# Patient Record
Sex: Male | Born: 1965 | Race: White | Hispanic: No | Marital: Married | Smoking: Former smoker
Health system: Southern US, Community
[De-identification: ages and names within clinical notes are randomized; demographics above are authoritative.]

## PROBLEM LIST (undated history)

## (undated) DIAGNOSIS — E785 Hyperlipidemia, unspecified: Secondary | ICD-10-CM

## (undated) DIAGNOSIS — E119 Type 2 diabetes mellitus without complications: Secondary | ICD-10-CM

## (undated) DIAGNOSIS — I10 Essential (primary) hypertension: Secondary | ICD-10-CM

## (undated) HISTORY — PX: MOHS SURGERY: SHX181

## (undated) HISTORY — DX: Hyperlipidemia, unspecified: E78.5

---

## 2001-05-15 ENCOUNTER — Encounter: Payer: Self-pay | Admitting: Internal Medicine

## 2001-05-15 ENCOUNTER — Ambulatory Visit (HOSPITAL_COMMUNITY): Admission: RE | Admit: 2001-05-15 | Discharge: 2001-05-15 | Payer: Self-pay | Admitting: Internal Medicine

## 2002-09-18 ENCOUNTER — Other Ambulatory Visit: Admission: RE | Admit: 2002-09-18 | Discharge: 2002-09-18 | Payer: Self-pay | Admitting: Dermatology

## 2009-10-03 ENCOUNTER — Emergency Department (HOSPITAL_COMMUNITY): Admission: EM | Admit: 2009-10-03 | Discharge: 2009-10-03 | Payer: Self-pay | Admitting: Emergency Medicine

## 2017-02-06 ENCOUNTER — Other Ambulatory Visit (HOSPITAL_COMMUNITY): Payer: Self-pay | Admitting: Pulmonary Disease

## 2017-02-06 DIAGNOSIS — R310 Gross hematuria: Secondary | ICD-10-CM

## 2017-02-10 ENCOUNTER — Ambulatory Visit (HOSPITAL_COMMUNITY)
Admission: RE | Admit: 2017-02-10 | Discharge: 2017-02-10 | Disposition: A | Discharge status: Home / Self Care | Payer: BC Managed Care – PPO | Source: Ambulatory Visit | Attending: Pulmonary Disease | Admitting: Pulmonary Disease

## 2017-02-10 DIAGNOSIS — R310 Gross hematuria: Secondary | ICD-10-CM | POA: Diagnosis present

## 2017-11-25 ENCOUNTER — Encounter (HOSPITAL_COMMUNITY): Payer: Self-pay | Admitting: Emergency Medicine

## 2017-11-25 ENCOUNTER — Emergency Department (HOSPITAL_COMMUNITY)
Admission: EM | Admit: 2017-11-25 | Discharge: 2017-11-25 | Disposition: A | Discharge status: Home / Self Care | Payer: BC Managed Care – PPO | Attending: Emergency Medicine | Admitting: Emergency Medicine

## 2017-11-25 ENCOUNTER — Emergency Department (HOSPITAL_COMMUNITY): Payer: BC Managed Care – PPO

## 2017-11-25 ENCOUNTER — Other Ambulatory Visit: Payer: Self-pay

## 2017-11-25 DIAGNOSIS — I10 Essential (primary) hypertension: Secondary | ICD-10-CM | POA: Insufficient documentation

## 2017-11-25 DIAGNOSIS — F17220 Nicotine dependence, chewing tobacco, uncomplicated: Secondary | ICD-10-CM | POA: Diagnosis not present

## 2017-11-25 DIAGNOSIS — R109 Unspecified abdominal pain: Secondary | ICD-10-CM | POA: Diagnosis present

## 2017-11-25 DIAGNOSIS — Z79899 Other long term (current) drug therapy: Secondary | ICD-10-CM | POA: Insufficient documentation

## 2017-11-25 DIAGNOSIS — E119 Type 2 diabetes mellitus without complications: Secondary | ICD-10-CM | POA: Insufficient documentation

## 2017-11-25 DIAGNOSIS — Z87442 Personal history of urinary calculi: Secondary | ICD-10-CM | POA: Diagnosis not present

## 2017-11-25 DIAGNOSIS — N201 Calculus of ureter: Secondary | ICD-10-CM

## 2017-11-25 DIAGNOSIS — N23 Unspecified renal colic: Secondary | ICD-10-CM

## 2017-11-25 DIAGNOSIS — N132 Hydronephrosis with renal and ureteral calculous obstruction: Secondary | ICD-10-CM | POA: Diagnosis not present

## 2017-11-25 HISTORY — DX: Essential (primary) hypertension: I10

## 2017-11-25 HISTORY — DX: Type 2 diabetes mellitus without complications: E11.9

## 2017-11-25 LAB — URINALYSIS, ROUTINE W REFLEX MICROSCOPIC
Bacteria, UA: NONE SEEN
Bilirubin Urine: NEGATIVE
Glucose, UA: NEGATIVE mg/dL
Ketones, ur: 5 mg/dL — AB
Leukocytes, UA: NEGATIVE
Nitrite: NEGATIVE
Protein, ur: NEGATIVE mg/dL
Specific Gravity, Urine: 1.027 (ref 1.005–1.030)
pH: 5 (ref 5.0–8.0)

## 2017-11-25 LAB — I-STAT CHEM 8, ED
BUN: 25 mg/dL — AB (ref 6–20)
CREATININE: 1.3 mg/dL — AB (ref 0.61–1.24)
Calcium, Ion: 1.13 mmol/L — ABNORMAL LOW (ref 1.15–1.40)
Chloride: 103 mmol/L (ref 101–111)
GLUCOSE: 175 mg/dL — AB (ref 65–99)
HEMATOCRIT: 43 % (ref 39.0–52.0)
Hemoglobin: 14.6 g/dL (ref 13.0–17.0)
POTASSIUM: 3.8 mmol/L (ref 3.5–5.1)
Sodium: 141 mmol/L (ref 135–145)
TCO2: 24 mmol/L (ref 22–32)

## 2017-11-25 MED ORDER — ONDANSETRON 4 MG PO TBDP: 4.0000 mg | ORAL_TABLET | Freq: Three times a day (TID) | ORAL | 0 refills | Status: DC | PRN

## 2017-11-25 MED ORDER — FENTANYL CITRATE (PF) 100 MCG/2ML IJ SOLN
50.0000 ug | INTRAMUSCULAR | Status: DC | PRN
  Administered 2017-11-25: 50 ug via INTRAVENOUS
  Filled 2017-11-25: qty 2

## 2017-11-25 MED ORDER — OXYCODONE-ACETAMINOPHEN 5-325 MG PO TABS
2.0000 | ORAL_TABLET | Freq: Once | ORAL | Status: AC
  Administered 2017-11-25: 2 via ORAL
  Filled 2017-11-25: qty 2

## 2017-11-25 MED ORDER — MORPHINE SULFATE (PF) 4 MG/ML IV SOLN
4.0000 mg | INTRAVENOUS | Status: DC | PRN
  Administered 2017-11-25: 4 mg via INTRAVENOUS
  Filled 2017-11-25: qty 1

## 2017-11-25 MED ORDER — ONDANSETRON HCL 4 MG/2ML IJ SOLN
4.0000 mg | Freq: Once | INTRAMUSCULAR | Status: AC
  Administered 2017-11-25: 4 mg via INTRAVENOUS
  Filled 2017-11-25: qty 2

## 2017-11-25 MED ORDER — OXYCODONE-ACETAMINOPHEN 5-325 MG PO TABS: ORAL_TABLET | ORAL | 0 refills | Status: DC

## 2017-11-25 NOTE — ED Triage Notes (Signed)
Pt C/O flank pain that started around 0230 this morning. Pt states he has vomited 3 times since 0230.

## 2017-11-25 NOTE — ED Provider Notes (Signed)
[facility] EMERGENCY DEPARTMENT Provider Note   CSN: [id number] Arrival date & time: 11/25/17  0630     History   Chief Complaint Chief Complaint  Patient presents with  . Flank Pain    HPI Chris [name] is a 52 y.o. male.  HPI  Pt was seen at 0710. Per pt, c/o sudden onset and persistence of constant left sided flank "pain" that began 0200 PTA.  Pt describes the pain as "like my last kidney stone."  Has been associated with multiple intermittent episodes of N/V.  Denies testicular pain/swelling, no dysuria/hematuria, no abd pain, no diarrhea, no black or blood in emesis, no CP/SOB, no fevers, no rash.    Past Medical History:  Diagnosis Date  . Diabetes mellitus without complication ([place])   . Hypertension     There are no active problems to display for this patient.   History reviewed. No pertinent surgical history.     Home Medications    Prior to Admission medications   Medication Sig Start Date End Date Taking? Authorizing Provider  finasteride (PROSCAR) 5 MG tablet Take 5 mg by mouth daily.   Yes [provider]  lisinopril (PRINIVIL,ZESTRIL) 5 MG tablet Take 5 mg by mouth daily.   Yes [provider]    Family History No family history on file.  Social History Social History   Tobacco Use  . Smoking status: Never Smoker  . Smokeless tobacco: Current User    Types: Chew  Substance Use Topics  . Alcohol use: No    Frequency: Never  . Drug use: No     Allergies   Patient has no known allergies.   Review of Systems Review of Systems ROS: Statement: All systems negative except as marked or noted in the HPI; Constitutional: Negative for fever and chills. ; ; Eyes: Negative for eye pain, redness and discharge. ; ; ENMT: Negative for ear pain, hoarseness, nasal congestion, sinus pressure and sore throat. ; ; Cardiovascular: Negative for chest pain, palpitations, diaphoresis, dyspnea and peripheral edema. ; ; Respiratory: Negative  for cough, wheezing and stridor. ; ; Gastrointestinal: +N/V. Negative for diarrhea, abdominal pain, blood in stool, hematemesis, jaundice and rectal bleeding. . ; ; Genitourinary: +flank pain. Negative for dysuria and hematuria. ; ; Genital:  No penile drainage or rash, no testicular pain or swelling, no scrotal rash or swelling. ;;  Musculoskeletal: Negative for back pain and neck pain. Negative for swelling and trauma.; ; Skin: Negative for pruritus, rash, abrasions, blisters, bruising and skin lesion.; ; Neuro: Negative for headache, lightheadedness and neck stiffness. Negative for weakness, altered level of consciousness, altered mental status, extremity weakness, paresthesias, involuntary movement, seizure and syncope.       Physical Exam Updated Vital Signs BP (!) 134/100 (BP Location: Right Arm)   Pulse 81   Temp 97.8 F (36.6 C) (Oral)   Resp 15   Wt 79.4 kg (175 lb)   SpO2 100%    BP 111/75   Pulse (!) 55   Temp 97.8 F (36.6 C) (Oral)   Resp 15   Wt 79.4 kg (175 lb)   SpO2 97%    Physical Exam 0715: Physical examination:  Nursing notes reviewed; Vital signs and O2 SAT reviewed;  Constitutional: Well developed, Well nourished, Well hydrated, Uncomfortable appearing.; Head:  Normocephalic, atraumatic; Eyes: EOMI, PERRL, No scleral icterus; ENMT: Mouth and pharynx normal, Mucous membranes moist; Neck: Supple, Full range of motion, No lymphadenopathy; Cardiovascular: Regular rate and rhythm, No  gallop; Respiratory: Breath sounds clear & equal bilaterally, No wheezes.  Speaking full sentences with ease, Normal respiratory effort/excursion; Chest: Nontender, Movement normal; Abdomen: Soft, Nontender, Nondistended, Normal bowel sounds; Genitourinary: No CVA tenderness; Spine:  No midline CS, TS, LS tenderness.;; Extremities: Pulses normal, No tenderness, No edema, No calf edema or asymmetry.; Neuro: AA&Ox3, Major CN grossly intact.  Speech clear. No gross focal motor or sensory deficits  in extremities.; Skin: Color normal, Warm, Dry.   ED Treatments / Results  Labs (all labs ordered are listed, but only abnormal results are displayed)   EKG  EKG Interpretation None       Radiology   Procedures Procedures (including critical care time)  Medications Ordered in ED Medications  fentaNYL (SUBLIMAZE) injection 50 mcg (50 mcg Intravenous Given 11/25/17 0703)  morphine 4 MG/ML injection 4 mg (4 mg Intravenous Given 11/25/17 0727)  ondansetron (ZOFRAN) injection 4 mg (4 mg Intravenous Given 11/25/17 0703)     Initial Impression / Assessment and Plan / ED Course  I have reviewed the triage vital signs and the nursing notes.  Pertinent labs & imaging results that were available during my care of the patient were reviewed by me and considered in my medical decision making (see chart for details).  MDM Reviewed: previous chart, vitals and nursing note Interpretation: labs and CT scan   Results for orders placed or performed during the hospital encounter of 11/25/17  Urinalysis, Routine w reflex microscopic- may I&O cath if menses  Result Value Ref Range   Color, Urine YELLOW YELLOW   APPearance HAZY (A) CLEAR   Specific Gravity, Urine 1.027 1.005 - 1.030   pH 5.0 5.0 - 8.0   Glucose, UA NEGATIVE NEGATIVE mg/dL   Hgb urine dipstick LARGE (A) NEGATIVE   Bilirubin Urine NEGATIVE NEGATIVE   Ketones, ur 5 (A) NEGATIVE mg/dL   Protein, ur NEGATIVE NEGATIVE mg/dL   Nitrite NEGATIVE NEGATIVE   Leukocytes, UA NEGATIVE NEGATIVE   RBC / HPF 6-30 0 - 5 RBC/hpf   WBC, UA 0-5 0 - 5 WBC/hpf   Bacteria, UA NONE SEEN NONE SEEN   Squamous Epithelial / LPF 0-5 (A) NONE SEEN   Mucus PRESENT   I-stat Chem 8, ED  Result Value Ref Range   Sodium 141 135 - 145 mmol/L   Potassium 3.8 3.5 - 5.1 mmol/L   Chloride 103 101 - 111 mmol/L   BUN 25 (H) 6 - 20 mg/dL   Creatinine, Ser 1.30 (H) 0.61 - 1.24 mg/dL   Glucose, Bld 175 (H) 65 - 99 mg/dL   Calcium, Ion 1.13 (L) 1.15 - 1.40  mmol/L   TCO2 24 22 - 32 mmol/L   Hemoglobin 14.6 13.0 - 17.0 g/dL   HCT 43.0 39.0 - 52.0 %   Ct Renal Stone Study Result Date: 11/25/2017 CLINICAL DATA:  Left flank pain EXAM: CT ABDOMEN AND PELVIS WITHOUT CONTRAST TECHNIQUE: Multidetector CT imaging of the abdomen and pelvis was performed following the standard protocol without IV contrast. COMPARISON:  03/06/2017 FINDINGS: Lower chest: Lung bases are clear. No effusions. Heart is normal size. Hepatobiliary: 14 mm gallstone within the gallbladder. No focal hepatic abnormality. Pancreas: No focal abnormality or ductal dilatation. Spleen: No focal abnormality.  Normal size. Adrenals/Urinary Tract: Adrenal glands unremarkable. Punctate 2-3 mm stone in the lower pole of the left kidney. There is moderate left hydronephrosis and perinephric stranding. 3 mm stone at the left ureterovesical junction. No stones or hydronephrosis on the right. The bladder wall  appears thickened. Stomach/Bowel: Appendix is normal. Stomach, large and small bowel grossly unremarkable. Vascular/Lymphatic: No evidence of aneurysm or adenopathy. Reproductive: Grossly unremarkable. Other: No free fluid or free air. Musculoskeletal: No acute bony abnormality. IMPRESSION: 3 mm left UVJ stone with moderate left hydronephrosis and perinephric stranding. Left lower pole nephrolithiasis. Bladder wall thickening which could be related to some degree of bladder outlet obstruction or cystitis. Recommend clinical correlation. Prostate does not appear enlarged. Cholelithiasis. Electronically Signed   By: Rolm Baptise M.D.   On: 11/25/2017 07:36    0845:  No old labs to compare. Will hold NSAID for now; given hx DM and ACEI. No UTI on Udip; UC pending. Pt has seen [facility] previously; will refer back to them for f/u. Dx and testing d/w pt and family.  Questions answered.  Verb understanding, agreeable to d/c home with outpt f/u.    Final Clinical Impressions(s) / ED Diagnoses   Final  diagnoses:  None    ED Discharge Orders    None       Francine Graven, DO 11/27/17 2202

## 2017-11-25 NOTE — Discharge Instructions (Signed)
Take the prescriptions as directed.  Also continue to take your Proscar.  Call the Urologist on Monday to schedule a follow up appointment in the next 3 days.  Return to the Emergency Department immediately if worsening.

## 2017-11-26 LAB — URINE CULTURE: CULTURE: NO GROWTH

## 2019-02-18 IMAGING — CT CT RENAL STONE PROTOCOL
2 of 4 series · 16 of 46 positions shown, 18 images · non-contrast
Comparison: 03/06/2017

CLINICAL DATA: Left flank pain

EXAM:
CT ABDOMEN AND PELVIS WITHOUT CONTRAST
TECHNIQUE: Multidetector CT imaging of the abdomen and pelvis was performed
following the standard protocol without IV contrast.

[Series 2: axial st · axial · 0.77mm/px · z∈[+891,+1316]mm · 13 of 93 slices shown, 15 images]
[im 4/93  soft-tissue]
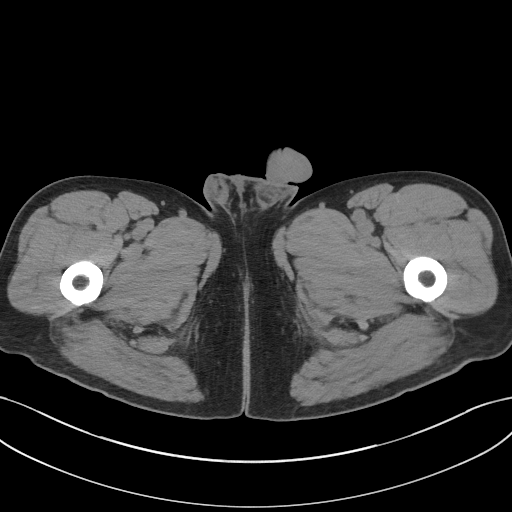
[im 4/93  bone]
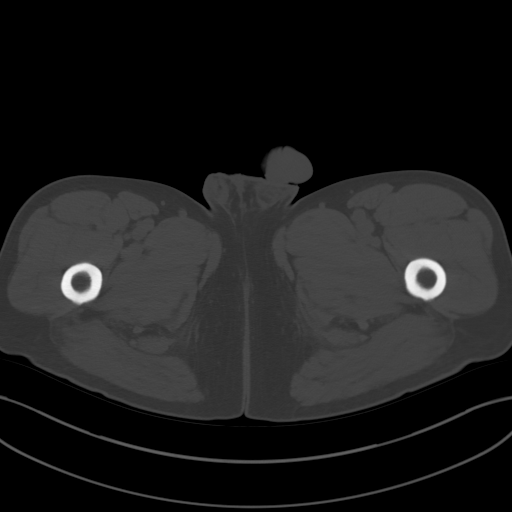
[im 12/93  soft-tissue]
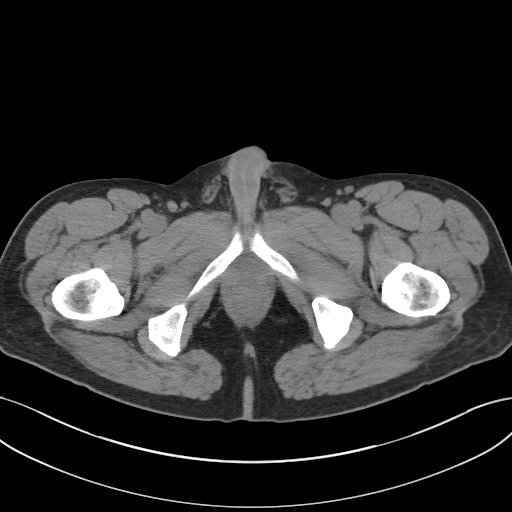
[im 19/93  soft-tissue]
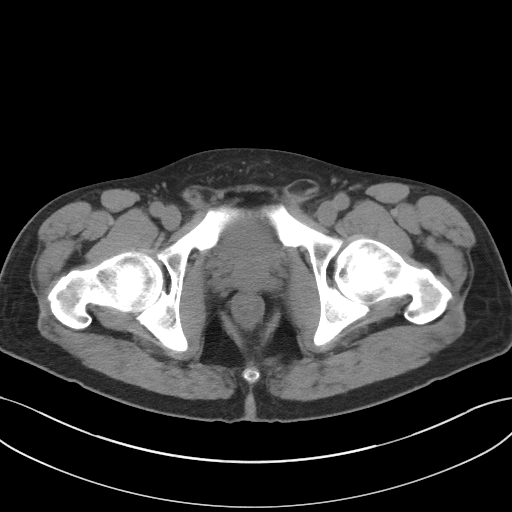
[im 26/93  soft-tissue]
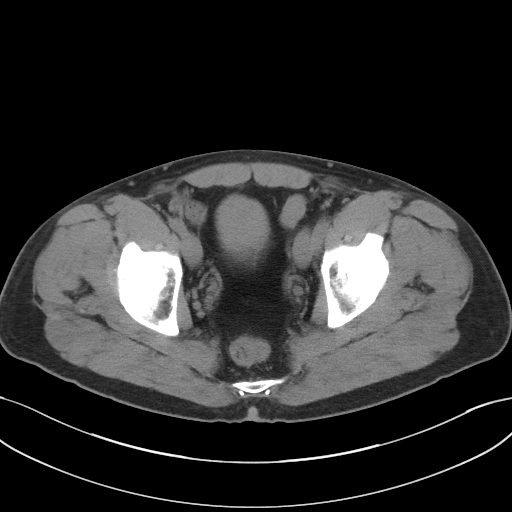
[im 34/93  soft-tissue]
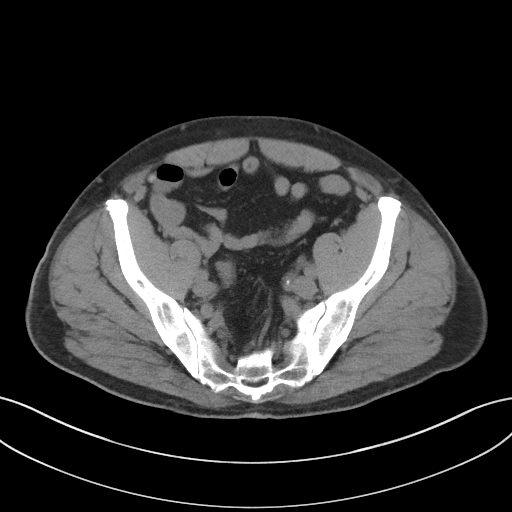
[im 41/93  soft-tissue]
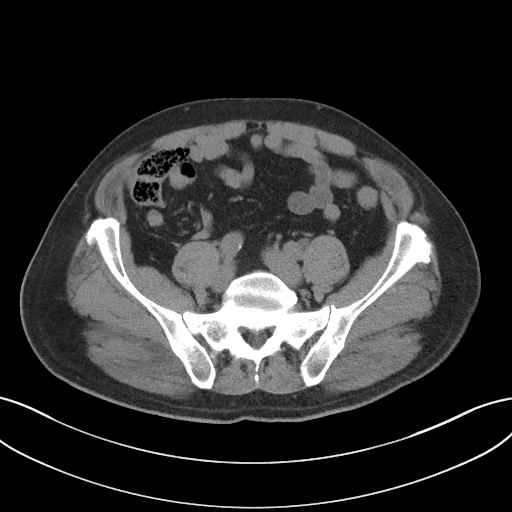
[im 48/93  soft-tissue]
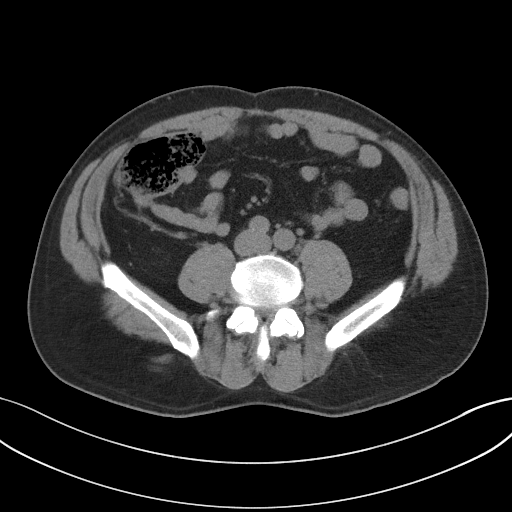
[im 52/93  soft-tissue]
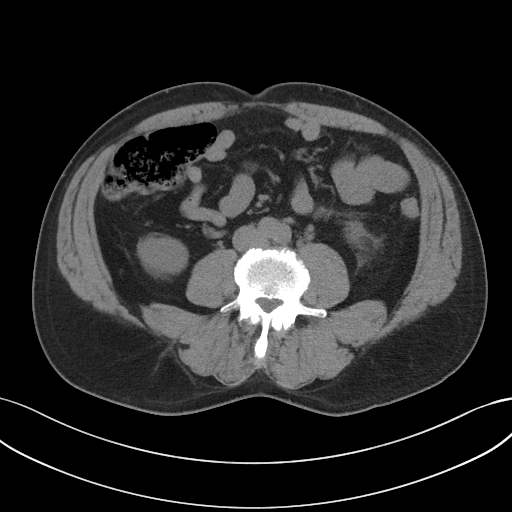
[im 59/93  soft-tissue]
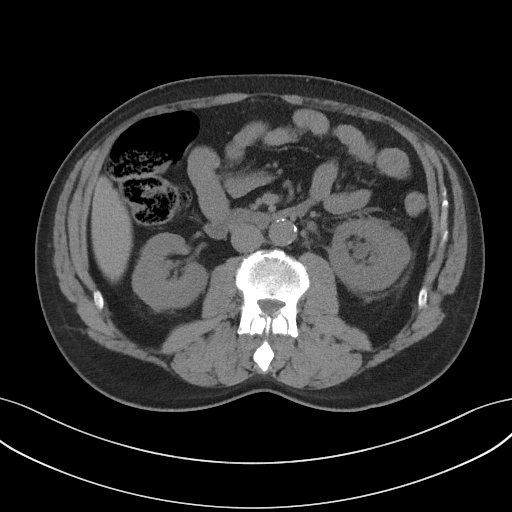
[im 59/93  bone]
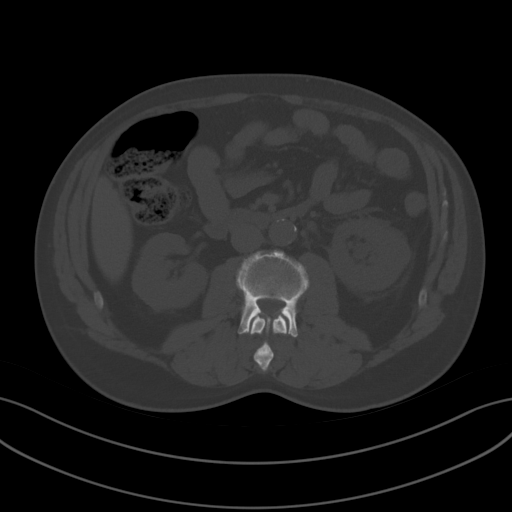
[im 67/93  soft-tissue]
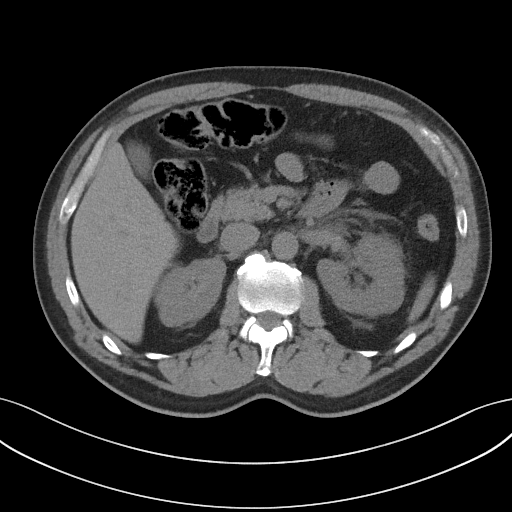
[im 74/93  soft-tissue]
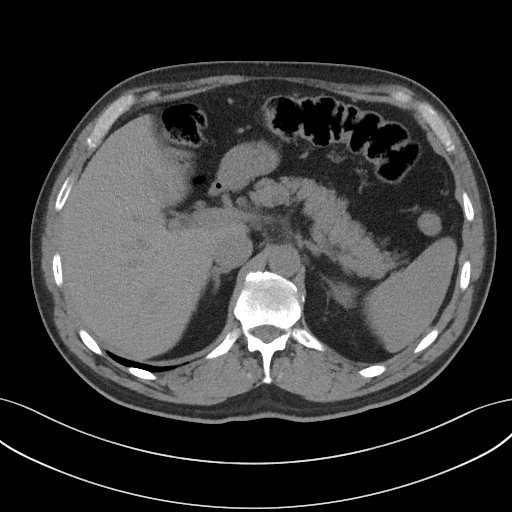
[im 81/93  soft-tissue]
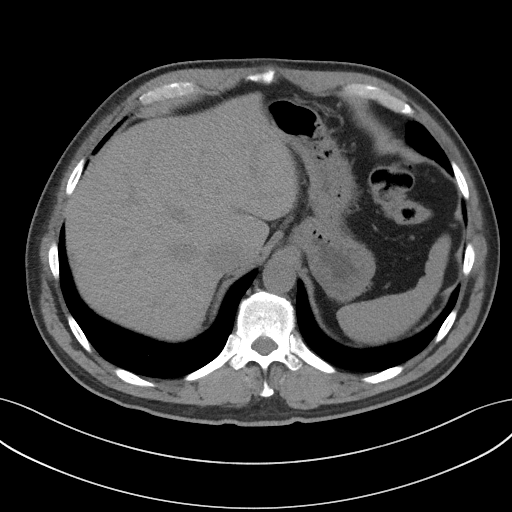
[im 89/93  soft-tissue]
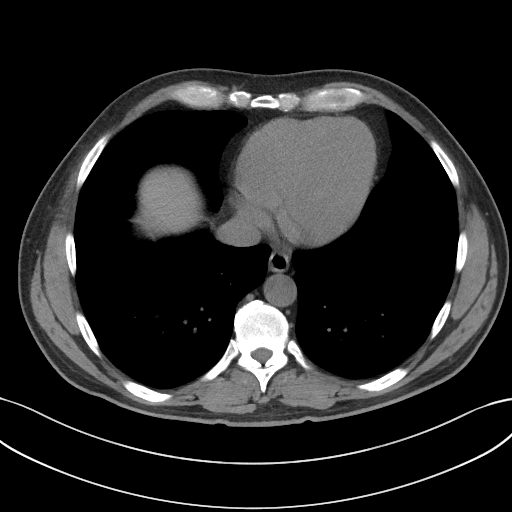

[Series 5: coronal st · coronal · 0.78mm/px · 3 of 110 slices shown]
[im 37/110  soft-tissue]
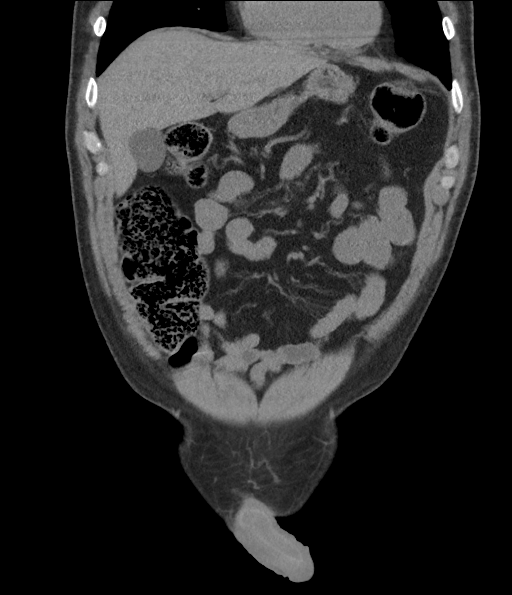
[im 49/110  soft-tissue]
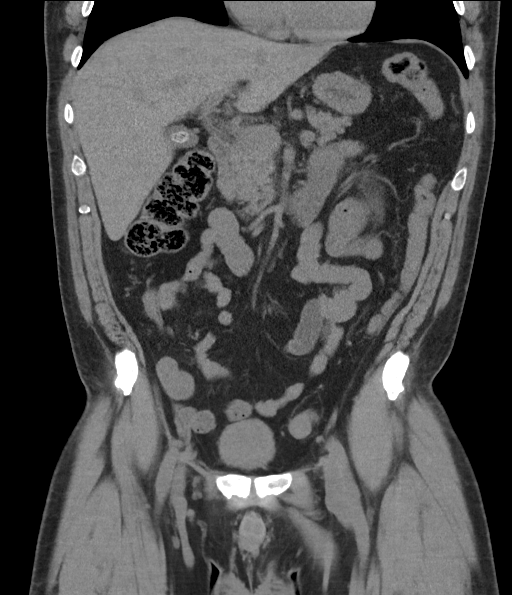
[im 61/110  soft-tissue]
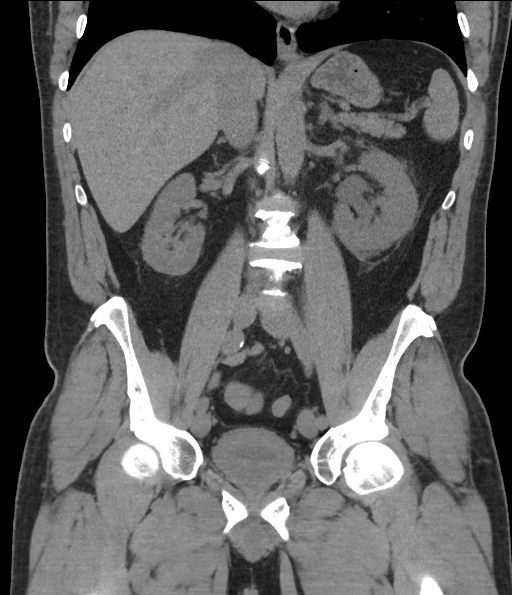

[16 of 46 positions shown; findings below may reference images not displayed]

FINDINGS: Lower chest: Lung bases are clear. No effusions. Heart is normal
size.

Hepatobiliary: 14 mm gallstone within the gallbladder. No focal
hepatic abnormality.

Pancreas: No focal abnormality or ductal dilatation.

Spleen: No focal abnormality.  Normal size.

Adrenals/Urinary Tract: Adrenal glands unremarkable. Punctate 2-3 mm
stone in the lower pole of the left kidney. There is moderate left
hydronephrosis and perinephric stranding. 3 mm stone at the left
ureterovesical junction. No stones or hydronephrosis on the right.
The bladder wall appears thickened.

Stomach/Bowel: Appendix is normal. Stomach, large and small bowel
grossly unremarkable.

Vascular/Lymphatic: No evidence of aneurysm or adenopathy.

Reproductive: Grossly unremarkable.

Other: No free fluid or free air.

Musculoskeletal: No acute bony abnormality.
IMPRESSION: 3 mm left UVJ stone with moderate left hydronephrosis and
perinephric stranding. Left lower pole nephrolithiasis.

Bladder wall thickening which could be related to some degree of
bladder outlet obstruction or cystitis. Recommend clinical
correlation. Prostate does not appear enlarged.

Cholelithiasis.

## 2019-11-13 ENCOUNTER — Ambulatory Visit: Payer: BC Managed Care – PPO | Admitting: Family Medicine

## 2020-03-03 ENCOUNTER — Ambulatory Visit: Payer: BC Managed Care – PPO | Admitting: Family Medicine

## 2020-05-06 ENCOUNTER — Ambulatory Visit: Payer: BC Managed Care – PPO | Admitting: Family Medicine

## 2020-05-06 ENCOUNTER — Encounter (INDEPENDENT_AMBULATORY_CARE_PROVIDER_SITE_OTHER): Payer: Self-pay | Admitting: *Deleted

## 2020-05-06 ENCOUNTER — Other Ambulatory Visit: Payer: Self-pay

## 2020-05-06 VITALS — BP 139/88 | HR 98 | Resp 16 | Ht 69.0 in | Wt 193.1 lb

## 2020-05-06 DIAGNOSIS — N401 Enlarged prostate with lower urinary tract symptoms: Secondary | ICD-10-CM | POA: Insufficient documentation

## 2020-05-06 DIAGNOSIS — I10 Essential (primary) hypertension: Secondary | ICD-10-CM | POA: Insufficient documentation

## 2020-05-06 DIAGNOSIS — Z1211 Encounter for screening for malignant neoplasm of colon: Secondary | ICD-10-CM | POA: Diagnosis not present

## 2020-05-06 DIAGNOSIS — Z0001 Encounter for general adult medical examination with abnormal findings: Secondary | ICD-10-CM | POA: Insufficient documentation

## 2020-05-06 DIAGNOSIS — Z6828 Body mass index (BMI) 28.0-28.9, adult: Secondary | ICD-10-CM | POA: Insufficient documentation

## 2020-05-06 DIAGNOSIS — R3912 Poor urinary stream: Secondary | ICD-10-CM

## 2020-05-06 DIAGNOSIS — E663 Overweight: Secondary | ICD-10-CM

## 2020-05-06 MED ORDER — LISINOPRIL 5 MG PO TABS: 5.0000 mg | ORAL_TABLET | Freq: Every day | ORAL | 1 refills | Status: DC

## 2020-05-06 MED ORDER — FINASTERIDE 5 MG PO TABS: 5.0000 mg | ORAL_TABLET | Freq: Every day | ORAL | 1 refills | Status: DC

## 2020-05-06 NOTE — Assessment & Plan Note (Signed)
Chris [name] is encouraged to maintain a well balanced diet that is low in salt. Higher level of normal control controlled, continue current medication regimen. Refills provided    Additionally, he is also reminded that exercise is beneficial for heart health and control of  Blood pressure. 30-60 minutes daily is recommended-walking was suggested.

## 2020-05-06 NOTE — Assessment & Plan Note (Signed)
Reestablishment with Proscar started.  He is encouraged to reach out if there is any problems or issues so that we can get him back into urology.

## 2020-05-06 NOTE — Assessment & Plan Note (Signed)
Deteriorated from previous numbers in chart. Chris [name] is educated about the importance of exercise daily to help with weight management. A minumum of 30 minutes daily is recommended. Additionally, importance of healthy food choices  with portion control discussed.  Wt Readings from Last 3 Encounters:  05/06/20 193 lb 1.9 oz (87.6 kg)  11/25/17 175 lb (79.4 kg)

## 2020-05-06 NOTE — Patient Instructions (Addendum)
I appreciate the opportunity to provide you with care for your health and wellness. Today we discussed: establish care   Follow up: 1 year for annual appt- morning fasting appt to get labs same day  No labs or referrals today  Nice to meet you today!  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  If you must go out, please wear a mask and practice good handwashing.  It was a pleasure to see you and I look forward to continuing to work together on your health and well-being. Please do not hesitate to call the office if you need care or have questions about your care.  Have a wonderful day and week. With Gratitude, Cherly Beach, DNP, AGNP-BC

## 2020-05-06 NOTE — Assessment & Plan Note (Signed)
Referral to Dr. Rosina Lowenstein office for colonoscopy provided.

## 2020-05-06 NOTE — Progress Notes (Signed)
Subjective:  Patient ID: Chris [name], male    DOB: 1966/06/26  Age: 54 y.o. MRN: [id number]  CC:  Chief Complaint  Patient presents with  . New Patient (Initial Visit)    establish care      HPI  HPI Chris [name] is a pleasant 54 year old gentleman previous patient of Dr. Luan Pulling who presents today to establish care.  Needs refills on his finasteride and lisinopril.  He has a limited history which includes hyperlipidemia, hypertension, BPH, type 2 diabetes that is diet controlled.  He denies having any issues or concerns today in the office.  He reports that he sleeps well.  He does have 2 bridges and sees a dentist regularly.  No trouble chewing or swallowing.  Reports some weak stream and a little bit of hesitancy with going to the bathroom that is consistent with BPH.  Needs to be restarted on Proscar.  Had blood in his urine previously but has not had any recently.  Denies having trouble going to have stools/bowel movements no blood in stools.  Denies having any memory issues.  Denies having any injuries or falls.  Denies having any hearing issues.  Reports he wears readers probably should see an eye doctor but overall doing well with his vision.  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  Had a previous history of Mohs surgery on the right side of his nose.  That was 15 years ago overall doing well since.  Health maintenance wise: He is overall up-to-date on all his vaccines.  Did get the J&J Covid vaccination.  Does need a colonoscopy is open to referral today.  He reports he is an avid runner up to 3 miles now frequently when he is running.  Drinks a decent amount of caffeine every day.  Works at [employer] in [employer] center.  Overall stress level is contained.  Lives with wife and daughter at home.  Today patient denies signs and symptoms of COVID 19 infection including fever, chills, cough, shortness of breath, and headache. Past Medical, Surgical, Social  History, Allergies, and Medications have been Reviewed.   Past Medical History:  Diagnosis Date  . Diabetes mellitus without complication ([place])   . Hyperlipidemia    Phreesia 05/06/2020  . Hypertension     Current Meds  Medication Sig  . finasteride (PROSCAR) 5 MG tablet Take 1 tablet (5 mg total) by mouth daily.  Marland Kitchen lisinopril (ZESTRIL) 5 MG tablet Take 1 tablet (5 mg total) by mouth daily.  . [DISCONTINUED] finasteride (PROSCAR) 5 MG tablet Take 5 mg by mouth daily.  . [DISCONTINUED] lisinopril (PRINIVIL,ZESTRIL) 5 MG tablet Take 5 mg by mouth daily.    ROS:  Review of Systems  Constitutional: Negative.   HENT: Negative.   Eyes: Negative.   Respiratory: Negative.   Cardiovascular: Negative.   Gastrointestinal: Negative.   Genitourinary: Negative.   Musculoskeletal: Negative.   Skin: Negative.   Neurological: Negative.   Endo/Heme/Allergies: Negative.   Psychiatric/Behavioral: Negative.   All other systems reviewed and are negative.    Objective:   Today's Vitals: BP 139/88   Pulse 98   Resp 16   Ht 5\' 9"  (1.753 m)   Wt 193 lb 1.9 oz (87.6 kg)   SpO2 95%   BMI 28.52 kg/m  Vitals with BMI 05/06/2020 11/25/2017 11/25/2017  Height 5\' 9"  - -  Weight 193 lbs 2 oz - 175 lbs  BMI 66.59 - -  Systolic  700 174 -  Diastolic 88 75 -  Pulse 98 55 -     Physical Exam Vitals and nursing note reviewed.  Constitutional:      Appearance: Normal appearance. He is well-developed, well-groomed and overweight.  HENT:     Head: Normocephalic and atraumatic.     Right Ear: External ear normal.     Left Ear: External ear normal.     Mouth/Throat:     Comments: Mask in place Eyes:     General:        Right eye: No discharge.        Left eye: No discharge.     Conjunctiva/sclera: Conjunctivae normal.  Cardiovascular:     Rate and Rhythm: Normal rate and regular rhythm.     Pulses: Normal pulses.     Heart sounds: Normal heart sounds.  Pulmonary:     Effort: Pulmonary  effort is normal.     Breath sounds: Normal breath sounds.  Musculoskeletal:        General: Normal range of motion.     Cervical back: Normal range of motion and neck supple.  Skin:    General: Skin is warm.  Neurological:     General: No focal deficit present.     Mental Status: He is alert and oriented to person, place, and time.  Psychiatric:        Attention and Perception: Attention normal.        Mood and Affect: Mood normal.        Speech: Speech normal.        Behavior: Behavior normal. Behavior is cooperative.        Thought Content: Thought content normal.        Cognition and Memory: Cognition normal.        Judgment: Judgment normal.     Comments: Good eye contact, good communication pleasant conversation    Assessment   1. Essential hypertension   2. Benign prostatic hyperplasia with weak urinary stream   3. Encounter for screening for malignant neoplasm of colon   4. Overweight with body mass index (BMI) of 28 to 28.9 in adult     Tests ordered Orders Placed This Encounter  Procedures  . Ambulatory referral to Gastroenterology     Plan: Please see assessment and plan per problem list above.   Meds ordered this encounter  Medications  . lisinopril (ZESTRIL) 5 MG tablet    Sig: Take 1 tablet (5 mg total) by mouth daily.    Dispense:  90 tablet    Refill:  1    Order Specific Question:   Supervising Provider    Answer:   SIMPSON, MARGARET E [[id number]]  . finasteride (PROSCAR) 5 MG tablet    Sig: Take 1 tablet (5 mg total) by mouth daily.    Dispense:  90 tablet    Refill:  1    Order Specific Question:   Supervising Provider    Answer:   Fayrene Helper [[id number]]    Patient to follow-up in 1 year, as needed Perlie Mayo, NP

## 2020-05-19 ENCOUNTER — Telehealth (INDEPENDENT_AMBULATORY_CARE_PROVIDER_SITE_OTHER): Payer: Self-pay | Admitting: *Deleted

## 2020-05-19 ENCOUNTER — Encounter (INDEPENDENT_AMBULATORY_CARE_PROVIDER_SITE_OTHER): Payer: Self-pay | Admitting: *Deleted

## 2020-05-19 ENCOUNTER — Other Ambulatory Visit (INDEPENDENT_AMBULATORY_CARE_PROVIDER_SITE_OTHER): Payer: Self-pay | Admitting: *Deleted

## 2020-05-19 MED ORDER — SUTAB 1479-225-188 MG PO TABS
1.0000 | ORAL_TABLET | Freq: Once | ORAL | 0 refills | Status: AC
Start: 2020-05-19 — End: 2020-05-19

## 2020-05-19 NOTE — Telephone Encounter (Signed)
Patient needs Sutab (copay card) ° °

## 2020-05-19 NOTE — Telephone Encounter (Signed)
Ok to schedule.  Thanks,  Bennye Nix Castaneda Mayorga, MD Gastroenterology and Hepatology [facility]  

## 2020-05-19 NOTE — Telephone Encounter (Signed)
Referring MD/PCP: mills   Procedure: tcs room 1 with BMP  Reason/Indication:  screening  Has patient had this procedure before?  no  If so, when, by whom and where?    Is there a family history of colon cancer?  no  Who?  What age when diagnosed?    Is patient diabetic?   no      Does patient have prosthetic heart valve or mechanical valve?  no  Do you have a pacemaker/defibrillator?  no  Has patient ever had endocarditis/atrial fibrillation? no  Does patient use oxygen? no  Has patient had joint replacement within last 12 months?  no  Is patient constipated or do they take laxatives? no  Does patient have a history of alcohol/drug use?  no  Is patient on blood thinner such as Coumadin, Plavix and/or Aspirin? no  Medications: lisinopril 5 mg daily, finasteride 5 mg daily, mvi daily  Allergies: nkda  Medication Adjustment per Dr Rehman/Dr Jenetta Downer   Procedure date & time: 06/16/20

## 2020-05-29 ENCOUNTER — Other Ambulatory Visit (INDEPENDENT_AMBULATORY_CARE_PROVIDER_SITE_OTHER): Payer: Self-pay

## 2020-05-29 DIAGNOSIS — Z1211 Encounter for screening for malignant neoplasm of colon: Secondary | ICD-10-CM

## 2020-06-09 ENCOUNTER — Encounter (HOSPITAL_COMMUNITY): Payer: Self-pay | Admitting: Gastroenterology

## 2020-06-15 ENCOUNTER — Other Ambulatory Visit: Payer: Self-pay

## 2020-06-15 ENCOUNTER — Other Ambulatory Visit (HOSPITAL_COMMUNITY)
Admission: RE | Admit: 2020-06-15 | Discharge: 2020-06-15 | Disposition: A | Discharge status: Home / Self Care | Payer: BC Managed Care – PPO | Source: Ambulatory Visit | Attending: Gastroenterology | Admitting: Gastroenterology

## 2020-06-15 DIAGNOSIS — Z79899 Other long term (current) drug therapy: Secondary | ICD-10-CM | POA: Diagnosis not present

## 2020-06-15 DIAGNOSIS — Z1211 Encounter for screening for malignant neoplasm of colon: Secondary | ICD-10-CM | POA: Diagnosis not present

## 2020-06-15 DIAGNOSIS — K648 Other hemorrhoids: Secondary | ICD-10-CM | POA: Diagnosis not present

## 2020-06-15 DIAGNOSIS — E119 Type 2 diabetes mellitus without complications: Secondary | ICD-10-CM | POA: Diagnosis not present

## 2020-06-15 DIAGNOSIS — K635 Polyp of colon: Secondary | ICD-10-CM | POA: Diagnosis not present

## 2020-06-15 DIAGNOSIS — Z20822 Contact with and (suspected) exposure to covid-19: Secondary | ICD-10-CM | POA: Diagnosis not present

## 2020-06-15 DIAGNOSIS — Z01812 Encounter for preprocedural laboratory examination: Secondary | ICD-10-CM | POA: Insufficient documentation

## 2020-06-15 DIAGNOSIS — I10 Essential (primary) hypertension: Secondary | ICD-10-CM | POA: Diagnosis not present

## 2020-06-15 DIAGNOSIS — D124 Benign neoplasm of descending colon: Secondary | ICD-10-CM | POA: Diagnosis not present

## 2020-06-15 LAB — BASIC METABOLIC PANEL
Anion gap: 10 (ref 5–15)
BUN: 29 mg/dL — ABNORMAL HIGH (ref 6–20)
CO2: 25 mmol/L (ref 22–32)
Calcium: 9.3 mg/dL (ref 8.9–10.3)
Chloride: 104 mmol/L (ref 98–111)
Creatinine, Ser: 0.9 mg/dL (ref 0.61–1.24)
GFR calc Af Amer: >60 mL/min (ref >60)
GFR calc non Af Amer: >60 mL/min (ref >60)
Glucose, Bld: 105 mg/dL — ABNORMAL HIGH (ref 70–99)
Potassium: 3.8 mmol/L (ref 3.5–5.1)
Sodium: 139 mmol/L (ref 135–145)

## 2020-06-15 LAB — SARS CORONAVIRUS 2 (TAT 6-24 HRS): SARS Coronavirus 2: NEGATIVE

## 2020-06-16 ENCOUNTER — Encounter (HOSPITAL_COMMUNITY): Payer: Self-pay | Admitting: Gastroenterology

## 2020-06-16 ENCOUNTER — Ambulatory Visit (HOSPITAL_COMMUNITY): Payer: BC Managed Care – PPO | Admitting: Anesthesiology

## 2020-06-16 ENCOUNTER — Ambulatory Visit (HOSPITAL_COMMUNITY)
Admission: RE | Admit: 2020-06-16 | Discharge: 2020-06-16 | Disposition: A | Discharge status: Home / Self Care | Payer: BC Managed Care – PPO | Attending: Gastroenterology | Admitting: Gastroenterology

## 2020-06-16 ENCOUNTER — Other Ambulatory Visit: Payer: Self-pay

## 2020-06-16 ENCOUNTER — Encounter (HOSPITAL_COMMUNITY)
Admission: RE | Disposition: A | Discharge status: Home / Self Care | Payer: Self-pay | Source: Home / Self Care | Attending: Gastroenterology

## 2020-06-16 DIAGNOSIS — K648 Other hemorrhoids: Secondary | ICD-10-CM | POA: Diagnosis not present

## 2020-06-16 DIAGNOSIS — D125 Benign neoplasm of sigmoid colon: Secondary | ICD-10-CM | POA: Diagnosis not present

## 2020-06-16 DIAGNOSIS — D124 Benign neoplasm of descending colon: Secondary | ICD-10-CM | POA: Diagnosis not present

## 2020-06-16 DIAGNOSIS — K635 Polyp of colon: Secondary | ICD-10-CM | POA: Insufficient documentation

## 2020-06-16 DIAGNOSIS — Z1211 Encounter for screening for malignant neoplasm of colon: Secondary | ICD-10-CM | POA: Diagnosis not present

## 2020-06-16 DIAGNOSIS — E119 Type 2 diabetes mellitus without complications: Secondary | ICD-10-CM | POA: Insufficient documentation

## 2020-06-16 DIAGNOSIS — I10 Essential (primary) hypertension: Secondary | ICD-10-CM | POA: Insufficient documentation

## 2020-06-16 DIAGNOSIS — Z79899 Other long term (current) drug therapy: Secondary | ICD-10-CM | POA: Insufficient documentation

## 2020-06-16 HISTORY — PX: POLYPECTOMY: SHX5525

## 2020-06-16 HISTORY — PX: COLONOSCOPY WITH PROPOFOL: SHX5780

## 2020-06-16 LAB — HM COLONOSCOPY

## 2020-06-16 SURGERY — COLONOSCOPY WITH PROPOFOL
Anesthesia: General

## 2020-06-16 MED ORDER — CHLORHEXIDINE GLUCONATE CLOTH 2 % EX PADS: 6.0000 | MEDICATED_PAD | Freq: Once | CUTANEOUS | Status: DC

## 2020-06-16 MED ORDER — PROPOFOL 10 MG/ML IV BOLUS
INTRAVENOUS | Status: DC | PRN
  Administered 2020-06-16: 50 mg via INTRAVENOUS
  Administered 2020-06-16: 150 mg via INTRAVENOUS

## 2020-06-16 MED ORDER — PROPOFOL 500 MG/50ML IV EMUL
INTRAVENOUS | Status: DC | PRN
  Administered 2020-06-16: 150 ug/kg/min via INTRAVENOUS

## 2020-06-16 MED ORDER — LACTATED RINGERS IV SOLN: INTRAVENOUS | Status: DC | PRN

## 2020-06-16 MED ORDER — LACTATED RINGERS IV SOLN: Freq: Once | INTRAVENOUS | Status: AC

## 2020-06-16 NOTE — Op Note (Signed)
[facility] Patient Name: Chris [name] Procedure Date: 06/16/2020 10:27 AM MRN: [id number] Date of Birth: 1966/03/31 Attending MD: Maylon Peppers ,  CSN: [id number] Age: 54 Admit Type: Outpatient Procedure:                Colonoscopy Indications:              Screening for colorectal malignant neoplasm Providers:                Maylon Peppers, Brea Sharon Seller, RN, Casimer Bilis, Technician Referring MD:              Medicines:                Monitored Anesthesia Care Complications:            No immediate complications. Estimated Blood Loss:     Estimated blood loss: none. Procedure:                Pre-Anesthesia Assessment:                           - Prior to the procedure, a History and Physical                            was performed, and patient medications, allergies                            and sensitivities were reviewed. The patient's                            tolerance of previous anesthesia was reviewed.                           - The risks and benefits of the procedure and the                            sedation options and risks were discussed with the                            patient. All questions were answered and informed                            consent was obtained.                           - ASA Grade Assessment: II - A patient with mild                            systemic disease.                           After obtaining informed consent, the colonoscope                            was passed under direct vision. Throughout the  procedure, the patient's blood pressure, pulse, and                            oxygen saturations were monitored continuously. The                            [id number]-[id number] ([id number]) scope was introduced through                            the anus and advanced to the the cecum, identified                            by appendiceal orifice and ileocecal valve.  The                            colonoscopy was performed without difficulty. The                            patient tolerated the procedure well. The quality                            of the bowel preparation was adequate. Scope                            withdrawal time was 17 minutes. Scope In: 10:43:18 AM Scope Out: 11:10:11 AM Scope Withdrawal Time: 0 hours 23 minutes 34 seconds  Total Procedure Duration: 0 hours 26 minutes 53 seconds  Findings:      The perianal and digital rectal examinations were normal.      A 3 mm polyp was found in the descending colon. The polyp was sessile.       The polyp was removed with a cold biopsy forceps. Resection and       retrieval were complete.      Two sessile polyps were found in the sigmoid colon. The polyps were 3 to       4 mm in size. These polyps were removed with a cold snare. Resection and       retrieval were complete.      Non-bleeding internal hemorrhoids were found during retroflexion. The       hemorrhoids were small. Impression:               - One 3 mm polyp in the descending colon, removed                            with a cold biopsy forceps. Resected and retrieved.                           - Two 3 to 4 mm polyps in the sigmoid colon,                            removed with a cold snare. Resected and retrieved.                           - Non-bleeding internal hemorrhoids. Moderate Sedation:  Per Anesthesia Care Recommendation:           - Discharge patient to home (ambulatory).                           - Resume previous diet.                           - Await pathology results.                           - Repeat colonoscopy for surveillance based on                            pathology results. Procedure Code(s):        --- Professional ---                           [phone], GC, Colonoscopy, flexible; with removal of                            tumor(s), polyp(s), or other lesion(s) by snare                             technique                           45380, 54, Colonoscopy, flexible; with biopsy,                            single or multiple Diagnosis Code(s):        --- Professional ---                           K63.5, Polyp of colon                           Z12.11, Encounter for screening for malignant                            neoplasm of colon                           K64.8, Other hemorrhoids CPT copyright 2019 American Medical Association. All rights reserved. The codes documented in this report are preliminary and upon coder review may  be revised to meet current compliance requirements. Maylon Peppers, MD Maylon Peppers,  06/16/2020 11:15:52 AM This report has been signed electronically. Number of Addenda: 0

## 2020-06-16 NOTE — H&P (Signed)
Chris [name] is an 54 y.o. male.   Chief Complaint: screening colonoscopy HPI: 54 y/o M with PMH HLD, HTN, DM, who comes to the hospital to undergo screening colonoscopy.  The patient has never had a colonoscopy in the past.  She denies having any complaints such as melena, hematochezia, abdominal pain or distention, change in her bowel movement consistency or frequency, no changes in her weight recently.  No family history of colorectal cancer.  Past Medical History:  Diagnosis Date  . Diabetes mellitus without complication ([place])   . Hyperlipidemia    [employer]rees[employer] 05/06/2020  . Hypertension     Past Surgical History:  Procedure Laterality Date  . MOHS SURGERY      History reviewed. No pertinent family history. Social History:  reports that he has never smoked. His smokeless tobacco use includes chew. He reports that he does not drink alcohol and does not use drugs.  Allergies: No Known Allergies  Medications Prior to Admission  Medication Sig Dispense Refill  . Cholecalciferol (VITAMIN D3 PO) Take 1 capsule by mouth daily.    . finasteride (PROSCAR) 5 MG tablet Take 1 tablet (5 mg total) by mouth daily. 90 tablet 1  . ibuprofen (ADVIL) 200 MG tablet Take 400 mg by mouth every 8 (eight) hours as needed for moderate pain.    Marland Kitchen lisinopril (ZESTRIL) 5 MG tablet Take 1 tablet (5 mg total) by mouth daily. 90 tablet 1  . loratadine (CLARITIN) 10 MG tablet Take 10 mg by mouth daily as needed for allergies.    . Multiple Vitamin (MULTIVITAMIN WITH MINERALS) TABS tablet Take 1 tablet by mouth daily.    Marland Kitchen zinc gluconate 50 MG tablet Take 50 mg by mouth daily.      Results for orders placed or performed during the hospital encounter of 06/16/20 (from the past 48 hour(s))  Basic metabolic panel     Status: Abnormal   Collection Time: 06/15/20  9:07 AM  Result Value Ref Range   Sodium 139 135 - 145 mmol/L   Potassium 3.8 3.5 - 5.1 mmol/L   Chloride 104 98 - 111 mmol/L   CO2 25 22 - 32  mmol/L   Glucose, Bld 105 (H) 70 - 99 mg/dL    Comment: Glucose reference range applies only to samples taken after fasting for at least 8 hours.   BUN 29 (H) 6 - 20 mg/dL   Creatinine, Ser 0.90 0.61 - 1.24 mg/dL   Calcium 9.3 8.9 - 10.3 mg/dL   GFR calc non Af Amer >60 >60 mL/min   GFR calc Af Amer >60 >60 mL/min   Anion gap 10 5 - 15    Comment: Performed at [facility], [address], [place], [place] [address]   No results found.  Review of Systems  Constitutional: Negative.   HENT: Negative.   Eyes: Negative.   Respiratory: Negative.   Cardiovascular: Negative.   Gastrointestinal: Negative.   Endocrine: Negative.   Genitourinary: Negative.   Musculoskeletal: Negative.   Skin: Negative.   Allergic/Immunologic: Negative.   Neurological: Negative.   Hematological: Negative.   Psychiatric/Behavioral: Negative.     Blood pressure (!) 144/87, temperature 97.8 F (36.6 C), temperature source Oral, resp. rate 13, height 5\' 9"  (1.753 m), weight 83.9 kg, SpO2 98 %. Physical Exam  GENERAL: The patient is AO x3, in no acute distress. HEENT: Head is normocephalic and atraumatic. EOMI are intact. Mouth is well hydrated and without lesions. NECK: Supple. No masses LUNGS:  Clear to auscultation. No presence of rhonchi/wheezing/rales. Adequate chest expansion HEART: RRR, normal s1 and s2. ABDOMEN: Soft, nontender, no guarding, no peritoneal signs, and nondistended. BS +. No masses. EXTREMITIES: Without any cyanosis, clubbing, rash, lesions or edema. NEUROLOGIC: AOx3, no focal motor deficit. SKIN: no jaundice, no rashes  Assessment/Plan 54 y/o M with PMH HLD, HTN, DM, who comes to the hospital to undergo screening colonoscopy. ,   The patient is at average risk for colorectal cancer.  We will proceed with colonoscopy today.   Harvel Quale, MD 06/16/2020, 8:42 AM

## 2020-06-16 NOTE — Anesthesia Postprocedure Evaluation (Signed)
Anesthesia Post Note  Patient: Nijah C Roessner  Procedure(s) Performed: COLONOSCOPY WITH PROPOFOL (N/A ) POLYPECTOMY  Patient location during evaluation: Endoscopy Anesthesia Type: General Level of consciousness: awake and alert and oriented Pain management: pain level controlled Vital Signs Assessment: post-procedure vital signs reviewed and stable Respiratory status: spontaneous breathing, nonlabored ventilation and respiratory function stable Cardiovascular status: blood pressure returned to baseline Postop Assessment: no headache and no apparent nausea or vomiting Anesthetic complications: no   No complications documented.   Last Vitals:  Vitals:   06/16/20 0801  BP: (!) 144/87  Resp: 13  Temp: 36.6 C  SpO2: 98%    Last Pain:  Vitals:   06/16/20 1037  TempSrc:   PainSc: 0-No pain                 Orlie Dakin

## 2020-06-16 NOTE — Anesthesia Preprocedure Evaluation (Addendum)
Anesthesia Evaluation  Patient identified by MRN, date of birth, ID band Patient awake    Reviewed: Allergy & Precautions, NPO status , Patient's Chart, lab work & pertinent test results  History of Anesthesia Complications Negative for: history of anesthetic complications  Airway Mallampati: II  TM Distance: >3 FB Neck ROM: Full    Dental  (+) Dental Advisory Given, Missing Bone graft in missing tooth 3 weeks ago:   Pulmonary neg pulmonary ROS,    Pulmonary exam normal breath sounds clear to auscultation       Cardiovascular Exercise Tolerance: Good hypertension, Pt. on medications Normal cardiovascular exam Rhythm:Regular Rate:Normal     Neuro/Psych negative neurological ROS  negative psych ROS   GI/Hepatic negative GI ROS, Neg liver ROS,   Endo/Other  diabetes (diet controlled), Well Controlled, Type 2  Renal/GU negative Renal ROS  negative genitourinary   Musculoskeletal negative musculoskeletal ROS (+)   Abdominal   Peds  Hematology negative hematology ROS (+)   Anesthesia Other Findings   Reproductive/Obstetrics negative OB ROS                           Anesthesia Physical Anesthesia Plan  ASA: II  Anesthesia Plan: General   Post-op Pain Management:    Induction: Intravenous  PONV Risk Score and Plan: TIVA  Airway Management Planned: Nasal Cannula and Natural Airway  Additional Equipment:   Intra-op Plan:   Post-operative Plan:   Informed Consent: I have reviewed the patients History and Physical, chart, labs and discussed the procedure including the risks, benefits and alternatives for the proposed anesthesia with the patient or authorized representative who has indicated his/her understanding and acceptance.       Plan Discussed with: CRNA and Surgeon  Anesthesia Plan Comments:         Anesthesia Quick Evaluation

## 2020-06-16 NOTE — Transfer of Care (Signed)
Immediate Anesthesia Transfer of Care Note  Patient: Chris [name]  Procedure(s) Performed: COLONOSCOPY WITH PROPOFOL (N/A ) POLYPECTOMY  Patient Location: Endoscopy Unit  Anesthesia Type:General  Level of Consciousness: awake, alert  and oriented  Airway & Oxygen Therapy: Patient Spontanous Breathing  Post-op Assessment: Report given to RN, Post -op Vital signs reviewed and stable and Patient moving all extremities X 4  Post vital signs: Reviewed and stable  Last Vitals:  Vitals Value Taken Time  BP    Temp    Pulse    Resp    SpO2      Last Pain:  Vitals:   06/16/20 1037  TempSrc:   PainSc: 0-No pain      Patients Stated Pain Goal: 9 (09/73/53 2992)  Complications: No complications documented.

## 2020-06-16 NOTE — Discharge Instructions (Signed)
You are being discharged to home.  Resume your previous diet.  We are waiting for your pathology results.  Your physician has recommended a repeat colonoscopy for surveillance based on pathology results.   PATIENT INSTRUCTIONS POST-ANESTHESIA  IMMEDIATELY FOLLOWING SURGERY:  Do not drive or operate machinery for the first twenty four hours after surgery.  Do not make any important decisions for twenty four hours after surgery or while taking narcotic pain medications or sedatives.  If you develop intractable nausea and vomiting or a severe headache please notify your doctor immediately.  FOLLOW-UP:  Please make an appointment with your surgeon as instructed. You do not need to follow up with anesthesia unless specifically instructed to do so.  WOUND CARE INSTRUCTIONS (if applicable):  Keep a dry clean dressing on the anesthesia/puncture wound site if there is drainage.  Once the wound has quit draining you may leave it open to air.  Generally you should leave the bandage intact for twenty four hours unless there is drainage.  If the epidural site drains for more than 36-48 hours please call the anesthesia department.  QUESTIONS?:  Please feel free to call your physician or the hospital operator if you have any questions, and they will be happy to assist you.       Colonoscopy, Adult, Care After This sheet gives you information about how to care for yourself after your procedure. Your doctor may also give you more specific instructions. If you have problems or questions, call your doctor. What can I expect after the procedure? After the procedure, it is common to have:  A small amount of blood in your poop (stool) for 24 hours.  Some gas.  Mild cramping or bloating in your belly (abdomen). Follow these instructions at home: Eating and drinking   Drink enough fluid to keep your pee (urine) pale yellow.  Follow instructions from your doctor about what you cannot eat or drink.  Return  to your normal diet as told by your doctor. Avoid heavy or fried foods that are hard to digest. Activity  Rest as told by your doctor.  Do not sit for a long time without moving. Get up to take short walks every 1-2 hours. This is important. Ask for help if you feel weak or unsteady.  Return to your normal activities as told by your doctor. Ask your doctor what activities are safe for you. To help cramping and bloating:   Try walking around.  Put heat on your belly as told by your doctor. Use the heat source that your doctor recommends, such as a moist heat pack or a heating pad. ? Put a towel between your skin and the heat source. ? Leave the heat on for 20-30 minutes. ? Remove the heat if your skin turns bright red. This is very important if you are unable to feel pain, heat, or cold. You may have a greater risk of getting burned. General instructions  For the first 24 hours after the procedure: ? Do not drive or use machinery. ? Do not sign important documents. ? Do not drink alcohol. ? Do your daily activities more slowly than normal. ? Eat foods that are soft and easy to digest.  Take over-the-counter or prescription medicines only as told by your doctor.  Keep all follow-up visits as told by your doctor. This is important. Contact a doctor if:  You have blood in your poop 2-3 days after the procedure. Get help right away if:  You have  more than a small amount of blood in your poop.  You see large clumps of tissue (blood clots) in your poop.  Your belly is swollen.  You feel like you may vomit (nauseous).  You vomit.  You have a fever.  You have belly pain that gets worse, and medicine does not help your pain. Summary  After the procedure, it is common to have a small amount of blood in your poop. You may also have mild cramping and bloating in your belly.  For the first 24 hours after the procedure, do not drive or use machinery, do not sign important  documents, and do not drink alcohol.  Get help right away if you have a lot of blood in your poop, feel like you may vomit, have a fever, or have more belly pain. This information is not intended to replace advice given to you by your health care provider. Make sure you discuss any questions you have with your health care provider. Document Revised: 04/01/2019 Document Reviewed: 04/01/2019 Elsevier Patient Education  [place].    Colon Polyps  Polyps are tissue growths inside the body. Polyps can grow in many places, including the large intestine (colon). A polyp may be a round bump or a mushroom-shaped growth. You could have one polyp or several. Most colon polyps are noncancerous (benign). However, some colon polyps can become cancerous over time. Finding and removing the polyps early can help prevent this. What are the causes? The exact cause of colon polyps is not known. What increases the risk? You are more likely to develop this condition if you:  Have a family history of colon cancer or colon polyps.  Are older than 74 or older than 45 if you are African American.  Have inflammatory bowel disease, such as ulcerative colitis or Crohn's disease.  Have certain hereditary conditions, such as: ? Familial adenomatous polyposis. ? Lynch syndrome. ? Turcot syndrome. ? Peutz-Jeghers syndrome.  Are overweight.  Smoke cigarettes.  Do not get enough exercise.  Drink too much alcohol.  Eat a diet that is high in fat and red meat and low in fiber.  Had childhood cancer that was treated with abdominal radiation. What are the signs or symptoms? Most polyps do not cause symptoms. If you have symptoms, they may include:  Blood coming from your rectum when having a bowel movement.  Blood in your stool. The stool may look dark red or black.  Abdominal pain.  A change in bowel habits, such as constipation or diarrhea. How is this diagnosed? This condition is diagnosed  with a colonoscopy. This is a procedure in which a lighted, flexible scope is inserted into the anus and then passed into the colon to examine the area. Polyps are sometimes found when a colonoscopy is done as part of routine cancer screening tests. How is this treated? Treatment for this condition involves removing any polyps that are found. Most polyps can be removed during a colonoscopy. Those polyps will then be tested for cancer. Additional treatment may be needed depending on the results of testing. Follow these instructions at home: Lifestyle  Maintain a healthy weight, or lose weight if recommended by your health care provider.  Exercise every day or as told by your health care provider.  Do not use any products that contain nicotine or tobacco, such as cigarettes and e-cigarettes. If you need help quitting, ask your health care provider.  If you drink alcohol, limit how much you have: ? 0-1  drink a day for women. ? 0-2 drinks a day for men.  Be aware of how much alcohol is in your drink. In the [place] one drink equals one 12 oz bottle of beer (355 mL), one 5 oz glass of wine (148 mL), or one 1 oz shot of hard liquor (44 mL). Eating and drinking   Eat foods that are high in fiber, such as fruits, vegetables, and whole grains.  Eat foods that are high in calcium and vitamin D, such as milk, cheese, yogurt, eggs, liver, fish, and broccoli.  Limit foods that are high in fat, such as fried foods and desserts.  Limit the amount of red meat and processed meat you eat, such as hot dogs, sausage, bacon, and lunch meats. General instructions  Keep all follow-up visits as told by your health care provider. This is important. ? This includes having regularly scheduled colonoscopies. ? Talk to your health care provider about when you need a colonoscopy. Contact a health care provider if:  You have new or worsening bleeding during a bowel movement.  You have new or increased blood in  your stool.  You have a change in bowel habits.  You lose weight for no known reason. Summary  Polyps are tissue growths inside the body. Polyps can grow in many places, including the colon.  Most colon polyps are noncancerous (benign), but some can become cancerous over time.  This condition is diagnosed with a colonoscopy.  Treatment for this condition involves removing any polyps that are found. Most polyps can be removed during a colonoscopy. This information is not intended to replace advice given to you by your health care provider. Make sure you discuss any questions you have with your health care provider. Document Revised: 12/21/2017 Document Reviewed: 12/21/2017 Elsevier Patient Education  [place].   Hemorrhoids Hemorrhoids are swollen veins that may develop:  In the butt (rectum). These are called internal hemorrhoids.  Around the opening of the butt (anus). These are called external hemorrhoids. Hemorrhoids can cause pain, itching, or bleeding. Most of the time, they do not cause serious problems. They usually get better with diet changes, lifestyle changes, and other home treatments. What are the causes? This condition may be caused by:  Having trouble pooping (constipation).  Pushing hard (straining) to poop.  Watery poop (diarrhea).  Pregnancy.  Being very overweight (obese).  Sitting for long periods of time.  Heavy lifting or other activity that causes you to strain.  Anal sex.  Riding a bike for a long period of time. What are the signs or symptoms? Symptoms of this condition include:  Pain.  Itching or soreness in the butt.  Bleeding from the butt.  Leaking poop.  Swelling in the area.  One or more lumps around the opening of your butt. How is this diagnosed? A doctor can often diagnose this condition by looking at the affected area. The doctor may also:  Do an exam that involves feeling the area with a gloved hand (digital  rectal exam).  Examine the area inside your butt using a small tube (anoscope).  Order blood tests. This may be done if you have lost a lot of blood.  Have you get a test that involves looking inside the colon using a flexible tube with a camera on the end (sigmoidoscopy or colonoscopy). How is this treated? This condition can usually be treated at home. Your doctor may tell you to change what you eat, make lifestyle changes, or  try home treatments. If these do not help, procedures can be done to remove the hemorrhoids or make them smaller. These may involve:  Placing rubber bands at the base of the hemorrhoids to cut off their blood supply.  Injecting medicine into the hemorrhoids to shrink them.  Shining a type of light energy onto the hemorrhoids to cause them to fall off.  Doing surgery to remove the hemorrhoids or cut off their blood supply. Follow these instructions at home: Eating and drinking   Eat foods that have a lot of fiber in them. These include whole grains, beans, nuts, fruits, and vegetables.  Ask your doctor about taking products that have added fiber (fibersupplements).  Reduce the amount of fat in your diet. You can do this by: ? Eating low-fat dairy products. ? Eating less red meat. ? Avoiding processed foods.  Drink enough fluid to keep your pee (urine) pale yellow. Managing pain and swelling   Take a warm-water bath (sitz bath) for 20 minutes to ease pain. Do this 3-4 times a day. You may do this in a bathtub or using a portable sitz bath that fits over the toilet.  If told, put ice on the painful area. It may be helpful to use ice between your warm baths. ? Put ice in a plastic bag. ? Place a towel between your skin and the bag. ? Leave the ice on for 20 minutes, 2-3 times a day. General instructions  Take over-the-counter and prescription medicines only as told by your doctor. ? Medicated creams and medicines may be used as told.  Exercise often.  Ask your doctor how much and what kind of exercise is best for you.  Go to the bathroom when you have the urge to poop. Do not wait.  Avoid pushing too hard when you poop.  Keep your butt dry and clean. Use wet toilet paper or moist towelettes after pooping.  Do not sit on the toilet for a long time.  Keep all follow-up visits as told by your doctor. This is important. Contact a doctor if you:  Have pain and swelling that do not get better with treatment or medicine.  Have trouble pooping.  Cannot poop.  Have pain or swelling outside the area of the hemorrhoids. Get help right away if you have:  Bleeding that will not stop. Summary  Hemorrhoids are swollen veins in the butt or around the opening of the butt.  They can cause pain, itching, or bleeding.  Eat foods that have a lot of fiber in them. These include whole grains, beans, nuts, fruits, and vegetables.  Take a warm-water bath (sitz bath) for 20 minutes to ease pain. Do this 3-4 times a day. This information is not intended to replace advice given to you by your health care provider. Make sure you discuss any questions you have with your health care provider. Document Revised: 09/13/2018 Document Reviewed: 01/25/2018 Elsevier Patient Education  [place].

## 2020-06-16 NOTE — Anesthesia Procedure Notes (Signed)
Date/Time: 06/16/2020 10:36 AM Performed by: Vista Deck, CRNA Pre-anesthesia Checklist: Patient identified, Emergency Drugs available, Suction available, Timeout performed and Patient being monitored Patient Re-evaluated:Patient Re-evaluated prior to induction Oxygen Delivery Method: Nasal Cannula

## 2020-06-17 LAB — SURGICAL PATHOLOGY

## 2020-06-22 ENCOUNTER — Encounter (HOSPITAL_COMMUNITY): Payer: Self-pay | Admitting: Gastroenterology

## 2020-07-25 ENCOUNTER — Other Ambulatory Visit: Payer: BC Managed Care – PPO

## 2020-07-25 ENCOUNTER — Other Ambulatory Visit: Payer: Self-pay

## 2020-07-25 DIAGNOSIS — Z20822 Contact with and (suspected) exposure to covid-19: Secondary | ICD-10-CM

## 2020-07-26 LAB — NOVEL CORONAVIRUS, NAA: SARS-CoV-2, NAA: NOT DETECTED

## 2020-07-26 LAB — SARS-COV-2, NAA 2 DAY TAT

## 2020-07-27 ENCOUNTER — Telehealth: Payer: Self-pay

## 2020-07-27 ENCOUNTER — Other Ambulatory Visit: Payer: BC Managed Care – PPO

## 2020-07-27 DIAGNOSIS — Z20822 Contact with and (suspected) exposure to covid-19: Secondary | ICD-10-CM

## 2020-07-27 NOTE — Telephone Encounter (Signed)
See other note

## 2020-07-28 LAB — SARS-COV-2, NAA 2 DAY TAT

## 2020-07-28 LAB — NOVEL CORONAVIRUS, NAA: SARS-CoV-2, NAA: NOT DETECTED

## 2020-07-28 NOTE — Telephone Encounter (Signed)
Lets see if second COVID swab is negative, if it is lets do phone visit to see what treatments we can do.

## 2020-07-28 NOTE — Telephone Encounter (Signed)
Pt informed

## 2020-07-28 NOTE — Telephone Encounter (Signed)
See other note

## 2020-08-17 ENCOUNTER — Encounter (INDEPENDENT_AMBULATORY_CARE_PROVIDER_SITE_OTHER): Payer: Self-pay | Admitting: *Deleted

## 2020-10-04 ENCOUNTER — Telehealth: Payer: BC Managed Care – PPO | Admitting: Family

## 2020-10-04 DIAGNOSIS — U071 COVID-19: Secondary | ICD-10-CM | POA: Diagnosis not present

## 2020-10-04 MED ORDER — FLUTICASONE PROPIONATE 50 MCG/ACT NA SUSP: 2.0000 | Freq: Every day | NASAL | 6 refills | Status: DC

## 2020-10-04 MED ORDER — ALBUTEROL SULFATE HFA 108 (90 BASE) MCG/ACT IN AERS: 2.0000 | INHALATION_SPRAY | Freq: Four times a day (QID) | RESPIRATORY_TRACT | 0 refills | Status: DC | PRN

## 2020-10-04 MED ORDER — BENZONATATE 100 MG PO CAPS: 100.0000 mg | ORAL_CAPSULE | Freq: Three times a day (TID) | ORAL | 0 refills | Status: DC | PRN

## 2020-10-04 NOTE — Progress Notes (Signed)
E-Visit for Corona Virus Screening  We are sorry you are not feeling well. We are here to help!  You have tested positive for COVID-19, meaning that you were infected with the novel coronavirus and could give the virus to others.  It is vitally important that you stay home so you do not spread it to others.      Please continue isolation at home, for at least 10 days since the start of your symptoms and until you have had 24 hours with no fever (without taking a fever reducer) and with improving of symptoms.  If you have no symptoms but tested positive (or all symptoms resolve after 5 days and you have no fever) you can leave your house but continue to wear a mask around others for an additional 5 days. If you have a fever,continue to stay home until you have had 24 hours of no fever. Most cases improve 5-10 days from onset but we have seen a small number of patients who have gotten worse after the 10 days.  Please be sure to watch for worsening symptoms and remain taking the proper precautions.   Go to the nearest hospital ED for assessment if fever/cough/breathlessness are severe or illness seems like a threat to life.    The following symptoms may appear 2-14 days after exposure: . Fever . Cough . Shortness of breath or difficulty breathing . Chills . Repeated shaking with chills . Muscle pain . Headache . Sore throat . New loss of taste or smell . Fatigue . Congestion or runny nose . Nausea or vomiting . Diarrhea  You have been enrolled in [facility] for COVID-19. Daily you will receive a questionnaire within the [facility] website. Our COVID-19 response team will be monitoring your responses daily.  You can use medication such as A prescription cough medication called Tessalon Perles 100 mg. You may take 1-2 capsules every 8 hours as needed for cough, A prescription inhaler called Albuterol MDI 90 mcg /actuation 2 puffs every 4 hours as needed for shortness of breath,  wheezing, cough and A prescription for Fluticasone nasal spray 2 sprays in each nostril one time per day     You may also take acetaminophen (Tylenol) as needed for fever.  HOME CARE: . Only take medications as instructed by your medical team. . Drink plenty of fluids and get plenty of rest. . A steam or ultrasonic humidifier can help if you have congestion.   GET HELP RIGHT AWAY IF YOU HAVE EMERGENCY WARNING SIGNS.  Call 911 or proceed to your closest emergency facility if: . You develop worsening high fever. . Trouble breathing . Bluish lips or face . Persistent pain or pressure in the chest . New confusion . Inability to wake or stay awake . You cough up blood. . Your symptoms become more severe . Inability to hold down food or fluids  This list is not all possible symptoms. Contact your medical provider for any symptoms that are severe or concerning to you.    Your e-visit answers were reviewed by a board certified advanced clinical practitioner to complete your personal care plan.  Depending on the condition, your plan could have included both over the counter or prescription medications.  If there is a problem please reply once you have received a response from your provider.  Your safety is important to [place].  If you have drug allergies check your prescription carefully.    You can use MyChart to ask questions  about today's visit, request a non-urgent call back, or ask for a work or school excuse for 24 hours related to this e-Visit. If it has been greater than 24 hours you will need to follow up with your provider, or enter a new e-Visit to address those concerns. You will get an e-mail in the next two days asking about your experience.  I hope that your e-visit has been valuable and will speed your recovery. Thank you for using e-visits.    Approximately 5 minutes was spent documenting and reviewing patient's chart.

## 2020-10-24 ENCOUNTER — Other Ambulatory Visit: Payer: Self-pay | Admitting: Family

## 2020-10-24 DIAGNOSIS — U071 COVID-19: Secondary | ICD-10-CM

## 2020-10-29 ENCOUNTER — Other Ambulatory Visit: Payer: Self-pay | Admitting: Family Medicine

## 2020-10-29 DIAGNOSIS — N401 Enlarged prostate with lower urinary tract symptoms: Secondary | ICD-10-CM

## 2020-10-29 DIAGNOSIS — I10 Essential (primary) hypertension: Secondary | ICD-10-CM

## 2021-02-01 ENCOUNTER — Other Ambulatory Visit: Payer: Self-pay | Admitting: Family

## 2021-02-01 ENCOUNTER — Other Ambulatory Visit: Payer: Self-pay | Admitting: Family Medicine

## 2021-02-01 DIAGNOSIS — U071 COVID-19: Secondary | ICD-10-CM

## 2021-04-01 ENCOUNTER — Other Ambulatory Visit: Payer: Self-pay | Admitting: Family

## 2021-04-01 DIAGNOSIS — U071 COVID-19: Secondary | ICD-10-CM

## 2021-04-28 ENCOUNTER — Other Ambulatory Visit: Payer: Self-pay

## 2021-04-28 DIAGNOSIS — I10 Essential (primary) hypertension: Secondary | ICD-10-CM

## 2021-04-28 DIAGNOSIS — N401 Enlarged prostate with lower urinary tract symptoms: Secondary | ICD-10-CM

## 2021-04-28 MED ORDER — LISINOPRIL 5 MG PO TABS: 5.0000 mg | ORAL_TABLET | Freq: Every day | ORAL | 1 refills | Status: DC

## 2021-04-28 MED ORDER — FINASTERIDE 5 MG PO TABS: 5.0000 mg | ORAL_TABLET | Freq: Every day | ORAL | 1 refills | Status: DC

## 2021-05-06 ENCOUNTER — Ambulatory Visit (INDEPENDENT_AMBULATORY_CARE_PROVIDER_SITE_OTHER): Payer: BC Managed Care – PPO | Admitting: Nurse Practitioner

## 2021-05-06 ENCOUNTER — Other Ambulatory Visit: Payer: Self-pay

## 2021-05-06 ENCOUNTER — Encounter: Payer: Self-pay | Admitting: Nurse Practitioner

## 2021-05-06 VITALS — BP 148/91 | HR 88 | Temp 98.1°F | Ht 69.0 in | Wt 191.0 lb

## 2021-05-06 DIAGNOSIS — Z0001 Encounter for general adult medical examination with abnormal findings: Secondary | ICD-10-CM

## 2021-05-06 DIAGNOSIS — Z139 Encounter for screening, unspecified: Secondary | ICD-10-CM

## 2021-05-06 DIAGNOSIS — I10 Essential (primary) hypertension: Secondary | ICD-10-CM | POA: Diagnosis not present

## 2021-05-06 DIAGNOSIS — N401 Enlarged prostate with lower urinary tract symptoms: Secondary | ICD-10-CM | POA: Diagnosis not present

## 2021-05-06 DIAGNOSIS — R3912 Poor urinary stream: Secondary | ICD-10-CM

## 2021-05-06 DIAGNOSIS — E119 Type 2 diabetes mellitus without complications: Secondary | ICD-10-CM

## 2021-05-06 NOTE — Assessment & Plan Note (Signed)
-  will screen for HCV and HIV with labs

## 2021-05-06 NOTE — Assessment & Plan Note (Signed)
-  checking PSA -no urinary issues reported

## 2021-05-06 NOTE — Patient Instructions (Signed)
Please have fasting labs drawn 2-3 days prior to your appointment so we can discuss the results during your office visit.  

## 2021-05-06 NOTE — Assessment & Plan Note (Signed)
-  he states his A1c was >10 with Dr. Luan Pulling, but he has been on a strict diet and exercising and that decreased his A1c to the point where he didn't need medication -with COVID, he states he has been eating worse -checking A1c today

## 2021-05-06 NOTE — Progress Notes (Signed)
Established Patient Office Visit  Subjective:  Patient ID: Chris [name], male    DOB: 1965-12-13  Age: 55 y.o. MRN: [id number]  CC:  Chief Complaint  Patient presents with   Annual Exam    CPE    HPI Chris [name] presents for physical exam. No recent labs.  Past Medical History:  Diagnosis Date   Diabetes mellitus without complication (HCC)    Hyperlipidemia    Phreesia 05/06/2020   Hypertension     Past Surgical History:  Procedure Laterality Date   COLONOSCOPY WITH PROPOFOL N/A 06/16/2020   Procedure: COLONOSCOPY WITH PROPOFOL;  Surgeon: Dolores Frame, MD;  Location: [facility];  Service: Gastroenterology;  Laterality: N/A;  900   MOHS SURGERY     POLYPECTOMY  06/16/2020   Procedure: POLYPECTOMY;  Surgeon: Dolores Frame, MD;  Location: [facility];  Service: Gastroenterology;;    History reviewed. No pertinent family history.  Social History   Socioeconomic History   Marital status: Married    Spouse name: Not on file   Number of children: 1   Years of education: Not on file   Highest education level: Not on file  Occupational History   Not on file  Tobacco Use   Smoking status: Never   Smokeless tobacco: Current    Types: Chew  Substance and Sexual Activity   Alcohol use: No    Comment: social 2-4 drinks a week    Drug use: No   Sexual activity: Yes  Other Topics Concern   Not on file  Social History Narrative   Lives with wife and daughter       Enjoys: outdoors stuff       Diet: eats all food groups    Caffeine: some coffee and tea daily 2-3 cups    Water: 6-8 cups daily      Wears seat belt    Does not use phone while driving    Smoke and carbon Magazine features editor in safe    Social Determinants of Health   Financial Resource Strain: Low Risk    Difficulty of Paying Living Expenses: Not hard at all  Food Insecurity: No Food Insecurity   Worried About Programme researcher, broadcasting/film/video in the  Last Year: Never true   Barista in the Last Year: Never true  Transportation Needs: No Transportation Needs   Lack of Transportation (Medical): No   Lack of Transportation (Non-Medical): No  Physical Activity: Sufficiently Active   Days of Exercise per Week: 5 days   Minutes of Exercise per Session: 30 min  Stress: No Stress Concern Present   Feeling of Stress : Only a little  Social Connections: Moderately Isolated   Frequency of Communication with Friends and Family: More than three times a week   Frequency of Social Gatherings with Friends and Family: Once a week   Attends Religious Services: Never   Database administrator or Organizations: No   Attends Engineer, structural: Never   Marital Status: Married  Catering manager Violence: Not At Risk   Fear of Current or Ex-Partner: No   Emotionally Abused: No   Physically Abused: No   Sexually Abused: No    Outpatient Medications Prior to Visit  Medication Sig Dispense Refill   Cholecalciferol (VITAMIN D3 PO) Take 1 capsule by mouth daily.     finasteride (PROSCAR) 5 MG tablet Take 1 tablet (5  mg total) by mouth daily. 90 tablet 1   fluticasone (FLONASE) 50 MCG/ACT nasal spray Place 2 sprays into both nostrils daily. 16 g 6   ibuprofen (ADVIL) 200 MG tablet Take 400 mg by mouth every 8 (eight) hours as needed for moderate pain.     lisinopril (ZESTRIL) 5 MG tablet Take 1 tablet (5 mg total) by mouth daily. 90 tablet 1   loratadine (CLARITIN) 10 MG tablet Take 10 mg by mouth daily as needed for allergies.     Multiple Vitamin (MULTIVITAMIN WITH MINERALS) TABS tablet Take 1 tablet by mouth daily.     albuterol (VENTOLIN HFA) 108 (90 Base) MCG/ACT inhaler Inhale 2 puffs into the lungs every 6 (six) hours as needed for wheezing or shortness of breath. (Patient not taking: Reported on 05/06/2021) 8 g 0   benzonatate (TESSALON PERLES) 100 MG capsule Take 1 capsule (100 mg total) by mouth 3 (three) times daily as needed.  (Patient not taking: Reported on 05/06/2021) 20 capsule 0   zinc gluconate 50 MG tablet Take 50 mg by mouth daily. (Patient not taking: Reported on 05/06/2021)     No facility-administered medications prior to visit.    No Known Allergies  ROS Review of Systems  Constitutional: Negative.   HENT: Negative.    Eyes: Negative.   Respiratory: Negative.    Cardiovascular: Negative.   Gastrointestinal: Negative.   Endocrine: Negative.   Genitourinary: Negative.   Musculoskeletal: Negative.   Skin: Negative.   Allergic/Immunologic: Negative.   Neurological: Negative.   Hematological: Negative.   Psychiatric/Behavioral: Negative.       Objective:    Physical Exam Constitutional:      Appearance: Normal appearance. He is obese.  HENT:     Head: Normocephalic and atraumatic.     Right Ear: Tympanic membrane, ear canal and external ear normal.     Left Ear: Tympanic membrane, ear canal and external ear normal.     Nose: Nose normal.     Mouth/Throat:     Mouth: Mucous membranes are moist.     Pharynx: Oropharynx is clear.  Eyes:     Extraocular Movements: Extraocular movements intact.     Conjunctiva/sclera: Conjunctivae normal.     Pupils: Pupils are equal, round, and reactive to light.  Cardiovascular:     Rate and Rhythm: Normal rate and regular rhythm.     Pulses: Normal pulses.     Heart sounds: Normal heart sounds.  Pulmonary:     Effort: Pulmonary effort is normal.     Breath sounds: Normal breath sounds.  Abdominal:     General: Abdomen is flat. Bowel sounds are normal.     Palpations: Abdomen is soft.  Musculoskeletal:        General: Normal range of motion.     Cervical back: Normal range of motion and neck supple.  Skin:    General: Skin is warm and dry.     Capillary Refill: Capillary refill takes less than 2 seconds.  Neurological:     General: No focal deficit present.     Mental Status: He is alert and oriented to person, place, and time.     Cranial  Nerves: No cranial nerve deficit.     Sensory: No sensory deficit.     Motor: No weakness.     Coordination: Coordination normal.     Gait: Gait normal.  Psychiatric:        Mood and Affect: Mood normal.  Behavior: Behavior normal.        Thought Content: Thought content normal.        Judgment: Judgment normal.     Comments: Anxious affect    BP (!) 148/91 (BP Location: Left Arm, Patient Position: Sitting, Cuff Size: Large)   Pulse 88   Temp 98.1 F (36.7 C) (Oral)   Ht $R'5\' 9"'Cd$  (1.753 m)   Wt 191 lb (86.6 kg)   SpO2 98%   BMI 28.21 kg/m  Wt Readings from Last 3 Encounters:  05/06/21 191 lb (86.6 kg)  06/16/20 185 lb (83.9 kg)  05/06/20 193 lb 1.9 oz (87.6 kg)     Health Maintenance Due  Topic Date Due   HEMOGLOBIN A1C  Never done   PNEUMOCOCCAL POLYSACCHARIDE VACCINE AGE 48-64 HIGH RISK  Never done   FOOT EXAM  Never done   OPHTHALMOLOGY EXAM  Never done   HIV Screening  Never done   Hepatitis C Screening  Never done   Zoster Vaccines- Shingrix (1 of 2) Never done   INFLUENZA VACCINE  04/19/2021    There are no preventive care reminders to display for this patient.  No results found for: TSH Lab Results  Component Value Date   HGB 14.6 11/25/2017   HCT 43.0 11/25/2017   Lab Results  Component Value Date   NA 139 06/15/2020   K 3.8 06/15/2020   CO2 25 06/15/2020   GLUCOSE 105 (H) 06/15/2020   BUN 29 (H) 06/15/2020   CREATININE 0.90 06/15/2020   CALCIUM 9.3 06/15/2020   ANIONGAP 10 06/15/2020   No results found for: CHOL No results found for: HDL No results found for: LDLCALC No results found for: TRIG No results found for: CHOLHDL No results found for: HGBA1C    Assessment & Plan:   Problem List Items Addressed This Visit       Cardiovascular and Mediastinum   Essential hypertension    BP Readings from Last 3 Encounters:  05/06/21 (!) 148/91  06/16/20 122/88  05/06/20 139/88  -he states he just had a large strong coffee and gets  nervous in doctor's office -he will check BP at home, if > 140/90 consistently he will return to clinic      Relevant Orders   CBC with Differential/Platelet   CMP14+EGFR   Lipid Panel With LDL/HDL Ratio   CBC with Differential/Platelet   CMP14+EGFR   Lipid Panel With LDL/HDL Ratio     Endocrine   Diabetes mellitus without complication ([facility])    -he states his A1c was >10 with Dr. Luan Pulling, but he has been on a strict diet and exercising and that decreased his A1c to the point where he didn't need medication -with COVID, he states he has been eating worse -checking A1c today      Relevant Orders   Hemoglobin A1c   CBC with Differential/Platelet   CMP14+EGFR   Lipid Panel With LDL/HDL Ratio   Hemoglobin A1c     Other   Encounter for general adult medical examination with abnormal findings - Primary   Relevant Orders   CBC with Differential/Platelet   CMP14+EGFR   Lipid Panel With LDL/HDL Ratio   TSH   Hepatitis C antibody   HIV Antibody (routine testing w rflx)   PSA   Benign prostatic hyperplasia with weak urinary stream    -checking PSA -no urinary issues reported      Relevant Orders   PSA   Screening due    -will screen  for HCV and HIV with labs      Relevant Orders   Hepatitis C antibody   HIV Antibody (routine testing w rflx)    No orders of the defined types were placed in this encounter.   Follow-up: Return in about 6 months (around 11/06/2021) for Lab follow-up (HTN).    Noreene Larsson, NP

## 2021-05-06 NOTE — Assessment & Plan Note (Signed)
BP Readings from Last 3 Encounters:  05/06/21 (!) 148/91  06/16/20 122/88  05/06/20 139/88   -he states he just had a large strong coffee and gets nervous in doctor's office -he will check BP at home, if > 140/90 consistently he will return to clinic

## 2021-05-07 ENCOUNTER — Telehealth: Payer: Self-pay

## 2021-05-07 ENCOUNTER — Encounter: Payer: BC Managed Care – PPO | Admitting: Family Medicine

## 2021-05-07 ENCOUNTER — Other Ambulatory Visit: Payer: Self-pay | Admitting: Nurse Practitioner

## 2021-05-07 DIAGNOSIS — E7800 Pure hypercholesterolemia, unspecified: Secondary | ICD-10-CM

## 2021-05-07 LAB — CBC WITH DIFFERENTIAL/PLATELET
Basophils Absolute: 0 10*3/uL (ref 0.0–0.2)
Basos: 0 %
EOS (ABSOLUTE): 0.1 10*3/uL (ref 0.0–0.4)
Eos: 1 %
Hematocrit: 43.7 % (ref 37.5–51.0)
Hemoglobin: 15.5 g/dL (ref 13.0–17.7)
Immature Grans (Abs): 0 10*3/uL (ref 0.0–0.1)
Immature Granulocytes: 0 %
Lymphocytes Absolute: 1.9 10*3/uL (ref 0.7–3.1)
Lymphs: 30 %
MCH: 31.5 pg (ref 26.6–33.0)
MCHC: 35.5 g/dL (ref 31.5–35.7)
MCV: 89 fL (ref 79–97)
Monocytes Absolute: 0.4 10*3/uL (ref 0.1–0.9)
Monocytes: 7 %
Neutrophils Absolute: 3.9 10*3/uL (ref 1.4–7.0)
Neutrophils: 62 %
Platelets: 177 10*3/uL (ref 150–450)
RBC: 4.92 x10E6/uL (ref 4.14–5.80)
RDW: 11.9 % (ref 11.6–15.4)
WBC: 6.3 10*3/uL (ref 3.4–10.8)

## 2021-05-07 LAB — LIPID PANEL WITH LDL/HDL RATIO
Cholesterol, Total: 227 mg/dL — ABNORMAL HIGH (ref 100–199)
HDL: 41 mg/dL (ref >39)
LDL Chol Calc (NIH): 164 mg/dL — ABNORMAL HIGH (ref 0–99)
LDL/HDL Ratio: 4 ratio — ABNORMAL HIGH (ref 0.0–3.6)
Triglycerides: 120 mg/dL (ref 0–149)
VLDL Cholesterol Cal: 22 mg/dL (ref 5–40)

## 2021-05-07 LAB — CMP14+EGFR
ALT: 30 IU/L (ref 0–44)
AST: 28 IU/L (ref 0–40)
Albumin/Globulin Ratio: 2.8 — ABNORMAL HIGH (ref 1.2–2.2)
Albumin: 4.8 g/dL (ref 3.8–4.9)
Alkaline Phosphatase: 47 IU/L (ref 44–121)
BUN/Creatinine Ratio: 21 — ABNORMAL HIGH (ref 9–20)
BUN: 20 mg/dL (ref 6–24)
Bilirubin Total: 0.5 mg/dL (ref 0.0–1.2)
CO2: 21 mmol/L (ref 20–29)
Calcium: 9.7 mg/dL (ref 8.7–10.2)
Chloride: 101 mmol/L (ref 96–106)
Creatinine, Ser: 0.94 mg/dL (ref 0.76–1.27)
Globulin, Total: 1.7 g/dL (ref 1.5–4.5)
Glucose: 150 mg/dL — ABNORMAL HIGH (ref 65–99)
Potassium: 4 mmol/L (ref 3.5–5.2)
Sodium: 139 mmol/L (ref 134–144)
Total Protein: 6.5 g/dL (ref 6.0–8.5)
eGFR: 95 mL/min/{1.73_m2} (ref >59)

## 2021-05-07 LAB — HEMOGLOBIN A1C
Est. average glucose Bld gHb Est-mCnc: 128 mg/dL
Hgb A1c MFr Bld: 6.1 % — ABNORMAL HIGH (ref 4.8–5.6)

## 2021-05-07 LAB — HIV ANTIBODY (ROUTINE TESTING W REFLEX): HIV Screen 4th Generation wRfx: NONREACTIVE

## 2021-05-07 LAB — HEPATITIS C ANTIBODY: Hep C Virus Ab: 0.1 s/co ratio (ref 0.0–0.9)

## 2021-05-07 LAB — TSH: TSH: 1.39 u[IU]/mL (ref 0.450–4.500)

## 2021-05-07 LAB — PSA: Prostate Specific Ag, Serum: <0.1 ng/mL (ref 0.0–4.0)

## 2021-05-07 MED ORDER — ATORVASTATIN CALCIUM 20 MG PO TABS: 20.0000 mg | ORAL_TABLET | Freq: Every day | ORAL | 3 refills | Status: DC

## 2021-05-07 NOTE — Progress Notes (Signed)
A1c is at 6.1, so that is great. Kidney and liver function are great as well. LDL, your bad cholesterol is elevated. I sent in atorvastatin for his cholesterol.

## 2021-05-07 NOTE — Progress Notes (Signed)
Rx. Atorvastatin for HLD

## 2021-05-07 NOTE — Telephone Encounter (Signed)
Patient returned your call in regards to lab results ph# [phone]

## 2021-11-11 ENCOUNTER — Ambulatory Visit: Payer: BC Managed Care – PPO | Admitting: Nurse Practitioner

## 2021-11-11 ENCOUNTER — Other Ambulatory Visit: Payer: Self-pay

## 2021-11-11 ENCOUNTER — Encounter: Payer: Self-pay | Admitting: Nurse Practitioner

## 2021-11-11 VITALS — BP 144/78 | HR 82 | Ht 69.0 in | Wt 184.0 lb

## 2021-11-11 DIAGNOSIS — E7800 Pure hypercholesterolemia, unspecified: Secondary | ICD-10-CM | POA: Diagnosis not present

## 2021-11-11 DIAGNOSIS — N401 Enlarged prostate with lower urinary tract symptoms: Secondary | ICD-10-CM

## 2021-11-11 DIAGNOSIS — R3912 Poor urinary stream: Secondary | ICD-10-CM

## 2021-11-11 DIAGNOSIS — E119 Type 2 diabetes mellitus without complications: Secondary | ICD-10-CM | POA: Diagnosis not present

## 2021-11-11 DIAGNOSIS — I10 Essential (primary) hypertension: Secondary | ICD-10-CM

## 2021-11-11 DIAGNOSIS — Z6828 Body mass index (BMI) 28.0-28.9, adult: Secondary | ICD-10-CM

## 2021-11-11 DIAGNOSIS — E663 Overweight: Secondary | ICD-10-CM

## 2021-11-11 DIAGNOSIS — E1169 Type 2 diabetes mellitus with other specified complication: Secondary | ICD-10-CM | POA: Insufficient documentation

## 2021-11-11 MED ORDER — FINASTERIDE 5 MG PO TABS: 5.0000 mg | ORAL_TABLET | Freq: Every day | ORAL | 1 refills | Status: DC

## 2021-11-11 MED ORDER — LISINOPRIL 5 MG PO TABS: 5.0000 mg | ORAL_TABLET | Freq: Every day | ORAL | 1 refills | Status: DC

## 2021-11-11 NOTE — Assessment & Plan Note (Signed)
Lab Results  Component Value Date   PSA1 <0.1 05/06/2021   Takes Proscar 5 mg tablet daily. Patient states that he has some hesitancy sometimes, he refused referral to urology.  He states that this symptom is not really bothering him. Continue Proscar 5 mg tablet daily

## 2021-11-11 NOTE — Progress Notes (Signed)
° °  Chris [name]     MRN: [id number]      DOB: 1966-07-26   HPI Chris [name] past medical history of type 2 diabetes, hypertension lipidemia, hypertension, BPH is here for follow up for his chronic medical conditions. he thinks that his Proscar is not helping as it used to be . He saw a urology some years ago and was started on Proscar 5 mg daily, has a little straining with urinating,pt states that he is not seriousl concerned about this. Pt denies bloody urine, painful urination  Patient stated that he has not been taking atorvastatin 20 mg because he knows different people that took the same medication and had muscular dystrophy, patient stated that he was not fasting when he had blood work done 6 months ago.   Patient refused flu vaccine today need for vaccine discussed with patient he verbalized understanding  There are no new concerns.  There are no specific complaints    ROS Denies recent fever or chills. Denies sinus pressure, nasal congestion, ear pain or sore throat. Denies chest congestion, productive cough or wheezing. Denies chest pains, palpitations and leg swelling Denies abdominal pain, nausea, vomiting,diarrhea or constipation.   Denies dysuria, frequency,  or incontinence has hesitancy Denies joint pain, swelling and limitation in mobility. Denies headaches, seizures, numbness, or tingling. Denies depression, anxiety or insomnia. Denies skin break down or rash.   PE  BP (!) 144/78    Pulse 82    Ht 5\' 9"  (1.753 m)    Wt 184 lb 0.6 oz (83.5 kg)    SpO2 96%    BMI 27.18 kg/m   Patient alert and oriented and in no cardiopulmonary distress.  Chest: Clear to auscultation bilaterally.  CVS: S1, S2 no murmurs, no S3.Regular rate.  ABD: Soft non tender.   MS: Adequate ROM spine, shoulders, hips and knees.  Skin: Intact, no ulcerations or rash noted.  Psych: Good eye contact, normal affect. Memory intact not anxious or depressed appearing.  CNS: CN 2-12 intact,  power,  normal throughout.no focal deficits noted.   Assessment & Plan

## 2021-11-11 NOTE — Assessment & Plan Note (Signed)
Lab Results  Component Value Date   HGBA1C 6.1 (H) 05/06/2021   Patient states that his A1c was greater than 10 when he was with Dr. Luan Pulling. A1c 6 months ago was 6.1, patient is not on diabetic medication. Patient told to avoid sugar sweetened soda, Patient refused statin,need to be on statin or order cholesterol-lowering medications in order to help reduce risk of developing stroke discussed with patient.  Patient stated that he will continue to work on diet and increase exercise Patient is due for eye exam and foot exam, will do foot exam at next visit and schedule patient for eye exam at next visit,. A1c ordered today.

## 2021-11-11 NOTE — Assessment & Plan Note (Addendum)
Wt Readings from Last 3 Encounters:  11/11/21 184 lb 0.6 oz (83.5 kg)  05/06/21 191 lb (86.6 kg)  06/16/20 185 lb (83.9 kg)  Patient congratulated on losing 7 pounds since last visit. Importance of vigorous exercise 30 minutes 5 times a week and healthy food choices consisting of 70% of  veggies and proteins less carbohydrates discussed with patient he verbalized understanding.

## 2021-11-11 NOTE — Assessment & Plan Note (Signed)
BP Readings from Last 3 Encounters:  11/11/21 (!) 144/78  05/06/21 (!) 148/91  06/16/20 122/88  Patient states that he has been out off lisinopril 5 mg for about a week Lisinopril 5 mg tablet refilled today patient told to take medication daily DASHDiet advised, need for vigorous exercise 30 minutes 5 times a week discussed with patient he verbalized understanding. CMP plus EGFR ordered today

## 2021-11-11 NOTE — Patient Instructions (Signed)
Please get your fasting labs done as planned.   It is important that you exercise regularly at least 30 minutes 5 times a week.  Think about what you will eat, plan ahead. Choose " clean, green, fresh or frozen" over canned, processed or packaged foods which are more sugary, salty and fatty. 70 to 75% of food eaten should be vegetables and fruit. Three meals at set times with snacks allowed between meals, but they must be fruit or vegetables. Aim to eat over a 12 hour period , example 7 am to 7 pm, and STOP after  your last meal of the day. Drink water,generally about 64 ounces per day, no other drink is as healthy. Fruit juice is best enjoyed in a healthy way, by EATING the fruit.  Thanks for choosing [facility], we consider it a privelige to serve you.

## 2021-11-11 NOTE — Addendum Note (Signed)
Addended by: Renee Rival on: 11/11/2021 09:06 PM   Modules accepted: Level of Service

## 2021-11-11 NOTE — Assessment & Plan Note (Signed)
Has history of diabetes. Patient refused taking Lipitor that was ordered at last visit. Need for cholesterol lowering agents to help reduce risk of having CVA discussed with patient he verbalized understanding Lab Results  Component Value Date   LDLCALC 164 (H) 05/06/2021  Patient will have lipid panel checked tomorrow due to not fasting today.

## 2021-11-11 NOTE — Addendum Note (Signed)
Addended by: Renee Rival on: 11/11/2021 05:10 PM   Modules accepted: Level of Service

## 2021-11-13 ENCOUNTER — Other Ambulatory Visit: Payer: Self-pay | Admitting: Nurse Practitioner

## 2021-11-13 DIAGNOSIS — E7800 Pure hypercholesterolemia, unspecified: Secondary | ICD-10-CM

## 2021-11-13 LAB — CMP14+EGFR
ALT: 26 IU/L (ref 0–44)
AST: 33 IU/L (ref 0–40)
Albumin/Globulin Ratio: 2.7 — ABNORMAL HIGH (ref 1.2–2.2)
Albumin: 4.6 g/dL (ref 3.8–4.9)
Alkaline Phosphatase: 48 IU/L (ref 44–121)
BUN/Creatinine Ratio: 19 (ref 9–20)
BUN: 20 mg/dL (ref 6–24)
Bilirubin Total: 0.8 mg/dL (ref 0.0–1.2)
CO2: 23 mmol/L (ref 20–29)
Calcium: 9.1 mg/dL (ref 8.7–10.2)
Chloride: 106 mmol/L (ref 96–106)
Creatinine, Ser: 1.04 mg/dL (ref 0.76–1.27)
Globulin, Total: 1.7 g/dL (ref 1.5–4.5)
Glucose: 86 mg/dL (ref 70–99)
Potassium: 4.5 mmol/L (ref 3.5–5.2)
Sodium: 143 mmol/L (ref 134–144)
Total Protein: 6.3 g/dL (ref 6.0–8.5)
eGFR: 84 mL/min/{1.73_m2} (ref >59)

## 2021-11-13 LAB — LIPID PANEL
Chol/HDL Ratio: 4.5 ratio (ref 0.0–5.0)
Cholesterol, Total: 174 mg/dL (ref 100–199)
HDL: 39 mg/dL — ABNORMAL LOW (ref >39)
LDL Chol Calc (NIH): 120 mg/dL — ABNORMAL HIGH (ref 0–99)
Triglycerides: 80 mg/dL (ref 0–149)
VLDL Cholesterol Cal: 15 mg/dL (ref 5–40)

## 2021-11-13 LAB — HEMOGLOBIN A1C
Est. average glucose Bld gHb Est-mCnc: 114 mg/dL
Hgb A1c MFr Bld: 5.6 % (ref 4.8–5.6)

## 2021-11-13 MED ORDER — EZETIMIBE 10 MG PO TABS: 10.0000 mg | ORAL_TABLET | Freq: Every day | ORAL | 3 refills | Status: DC

## 2021-11-13 MED ORDER — ATORVASTATIN CALCIUM 40 MG PO TABS: 40.0000 mg | ORAL_TABLET | Freq: Every day | ORAL | 3 refills | Status: DC

## 2021-11-13 NOTE — Progress Notes (Signed)
Please review results with patient LDL is elevated patient should start taking ezetimibe 10mg  daily(this is not a statin). I have ordered labs to be done 3 to 5 days before follow-up appointment in 6 weeks to recheck his cholesterol.  A1c 5.6 well-controlled, congratulations.  Normal liver and kidney function

## 2022-05-04 ENCOUNTER — Other Ambulatory Visit: Payer: Self-pay | Admitting: Nurse Practitioner

## 2022-05-04 DIAGNOSIS — N401 Enlarged prostate with lower urinary tract symptoms: Secondary | ICD-10-CM

## 2022-05-07 ENCOUNTER — Other Ambulatory Visit: Payer: Self-pay | Admitting: Nurse Practitioner

## 2022-05-07 DIAGNOSIS — I10 Essential (primary) hypertension: Secondary | ICD-10-CM

## 2022-05-10 ENCOUNTER — Ambulatory Visit: Payer: BC Managed Care – PPO | Admitting: Nurse Practitioner

## 2022-05-24 ENCOUNTER — Ambulatory Visit: Payer: BC Managed Care – PPO | Admitting: Nurse Practitioner

## 2022-06-16 ENCOUNTER — Ambulatory Visit: Payer: BC Managed Care – PPO | Admitting: Nurse Practitioner

## 2022-06-21 ENCOUNTER — Ambulatory Visit: Payer: BC Managed Care – PPO | Admitting: Internal Medicine

## 2022-06-21 ENCOUNTER — Encounter: Payer: Self-pay | Admitting: Internal Medicine

## 2022-06-21 VITALS — BP 132/82 | HR 84 | Resp 18 | Ht 69.0 in | Wt 182.4 lb

## 2022-06-21 DIAGNOSIS — I10 Essential (primary) hypertension: Secondary | ICD-10-CM

## 2022-06-21 DIAGNOSIS — E119 Type 2 diabetes mellitus without complications: Secondary | ICD-10-CM | POA: Diagnosis not present

## 2022-06-21 DIAGNOSIS — N401 Enlarged prostate with lower urinary tract symptoms: Secondary | ICD-10-CM

## 2022-06-21 DIAGNOSIS — Z2821 Immunization not carried out because of patient refusal: Secondary | ICD-10-CM | POA: Insufficient documentation

## 2022-06-21 DIAGNOSIS — R3912 Poor urinary stream: Secondary | ICD-10-CM

## 2022-06-21 DIAGNOSIS — E7800 Pure hypercholesterolemia, unspecified: Secondary | ICD-10-CM

## 2022-06-21 NOTE — Progress Notes (Unsigned)
Established Patient Office Visit  Subjective   Patient ID: Chris [name], male    DOB: 10/11/1965  Age: 56 y.o. MRN: [id number]  Chief Complaint  Patient presents with   Follow-up    Follow up HTN DM HLD   Mr. Manganaro presents today for follow-up.  He is a 56 year old male with a past medical history significant for T2DM, HTN, HLD, and BPH.  He was last seen in follow-up by Vena Rua, NP on 11/11/2021 at which time Zetia 10 mg daily was prescribed.  There have been no acute interval events.  Today Mr. Pierotti states that he feels well.  He is asymptomatic and has no acute concerns.  On review of his current medications, Mr. Peretti reports that he is not currently taking Zetia because he believes this was a statin.  His medication list is otherwise up-to-date.  Acute concerns, chronic medical conditions, and outstanding preventative healthcare maintenance items discussed today are individually addressed in A/P below.  Past Medical History:  Diagnosis Date   Diabetes mellitus without complication ([place])    Hyperlipidemia    Phreesia 05/06/2020   Hypertension    Past Surgical History:  Procedure Laterality Date   COLONOSCOPY WITH PROPOFOL N/A 06/16/2020   Procedure: COLONOSCOPY WITH PROPOFOL;  Surgeon: Harvel Quale, MD;  [facility];  Service: Gastroenterology;  Laterality: N/A;  [place]     POLYPECTOMY  06/16/2020   Procedure: POLYPECTOMY;  Surgeon: Harvel Quale, MD;  Location: [facility];  Service: Gastroenterology;;   Social History   Tobacco Use   Smoking status: Never   Smokeless tobacco: Current    Types: Chew  Substance Use Topics   Alcohol use: No    Comment: social 2-4 drinks a week    Drug use: No   History reviewed. No pertinent family history. No Known Allergies  Review of Systems  Constitutional:  Negative for chills and fever.  HENT:  Negative for sore throat.   Respiratory:  Negative for cough and  shortness of breath.   Cardiovascular:  Negative for chest pain, palpitations and leg swelling.  Gastrointestinal:  Negative for abdominal pain, blood in stool, constipation, diarrhea, nausea and vomiting.  Genitourinary:  Negative for dysuria and hematuria.  Musculoskeletal:  Negative for myalgias.  Skin:  Negative for itching and rash.  Neurological:  Negative for dizziness and headaches.  Psychiatric/Behavioral:  Negative for depression and suicidal ideas.      Objective:     BP 132/82 (BP Location: Right Arm, Patient Position: Sitting, Cuff Size: Normal)   Pulse 84   Resp 18   Ht $R'5\' 9"'pn$  (1.753 m)   Wt 182 lb 6.4 oz (82.7 kg)   SpO2 97%   BMI 26.94 kg/m  BP Readings from Last 3 Encounters:  06/21/22 132/82  11/11/21 (!) 144/78  05/06/21 (!) 148/91   Physical Exam Vitals reviewed.  Constitutional:      General: He is not in acute distress.    Appearance: Normal appearance. He is not ill-appearing.  HENT:     Head: Normocephalic and atraumatic.     Nose: Nose normal. No congestion or rhinorrhea.     Mouth/Throat:     Mouth: Mucous membranes are moist.     Pharynx: Oropharynx is clear.  Eyes:     Extraocular Movements: Extraocular movements intact.     Conjunctiva/sclera: Conjunctivae normal.     Pupils: Pupils are equal, round, and reactive to light.  Cardiovascular:  Rate and Rhythm: Normal rate and regular rhythm.     Pulses: Normal pulses.     Heart sounds: Normal heart sounds. No murmur heard. Pulmonary:     Effort: Pulmonary effort is normal.     Breath sounds: Normal breath sounds. No wheezing, rhonchi or rales.  Abdominal:     General: Abdomen is flat. Bowel sounds are normal. There is no distension.     Palpations: Abdomen is soft.     Tenderness: There is no abdominal tenderness.  Musculoskeletal:        General: No swelling or deformity. Normal range of motion.     Cervical back: Normal range of motion.  Skin:    General: Skin is warm and dry.      Capillary Refill: Capillary refill takes less than 2 seconds.  Neurological:     General: No focal deficit present.     Mental Status: He is alert and oriented to person, place, and time.     Motor: No weakness.  Psychiatric:        Mood and Affect: Mood normal.        Behavior: Behavior normal.        Thought Content: Thought content normal.    Diabetic foot exam was performed.  No deformities or other abnormal visual findings.  Posterior tibialis and dorsalis pulse intact bilaterally.  Intact to touch and monofilament testing bilaterally.    Last CBC Lab Results  Component Value Date   WBC 6.9 06/21/2022   HGB 15.7 06/21/2022   HCT 44.9 06/21/2022   MCV 90 06/21/2022   MCH 31.4 06/21/2022   RDW 11.7 06/21/2022   PLT 183 08/05/3566   Last metabolic panel Lab Results  Component Value Date   GLUCOSE 100 (H) 06/21/2022   NA 141 06/21/2022   K 4.7 06/21/2022   CL 104 06/21/2022   CO2 22 06/21/2022   BUN 19 06/21/2022   CREATININE 0.99 06/21/2022   EGFR 89 06/21/2022   CALCIUM 9.4 06/21/2022   PROT 6.8 06/21/2022   ALBUMIN 5.0 (H) 06/21/2022   LABGLOB 1.8 06/21/2022   AGRATIO 2.8 (H) 06/21/2022   BILITOT 0.5 06/21/2022   ALKPHOS 45 06/21/2022   AST 30 06/21/2022   ALT 25 06/21/2022   ANIONGAP 10 06/15/2020   Last lipids Lab Results  Component Value Date   CHOL 192 06/21/2022   HDL 50 06/21/2022   LDLCALC 131 (H) 06/21/2022   TRIG 59 06/21/2022   CHOLHDL 3.8 06/21/2022   Last hemoglobin A1c Lab Results  Component Value Date   HGBA1C 5.7 (H) 06/21/2022   Last thyroid functions Lab Results  Component Value Date   TSH 1.390 05/06/2021   The 10-year ASCVD risk score (Arnett DK, et al., 2019) is: 14.7%    Assessment & Plan:   Problem List Items Addressed This Visit       Essential hypertension    BP 132/82 today.  He is currently prescribed lisinopril 5 mg daily. -No changes today.  Continue current regimen      Diabetes mellitus without  complication ([facility])    Last HgbA1c 5.6.  He is not currently taking any medications for diabetes control and has improved his A1c through lifestyle modifications.  He denies symptoms of polyuria/polydipsia currently. -No changes today -Repeat HgbA1c -Diabetic foot exam completed today -Urine microalbumin/creatinine ratio ordered -He is due for a diabetic eye exam and will contact his ophthalmologist to schedule appointment      Benign prostatic hyperplasia  with weak urinary stream    Symptoms well controlled with finasteride.  No changes today.      Pure hypercholesterolemia    He has previously declined statin therapy due to concern for long-term side effects.  He was prescribed Zetia 10 mg daily following his repeat lipid panel from February.  However, he has not been taking his medication because he thought it was a statin. -I reviewed with Mr. Batzel that ezetimibe is not a statin.  He is concerned about the long-term side effects of statin therapy, and is not sure that they would outweigh the benefits.  I acknowledged his concerns and reviewed with Mr. Shere how we interpret lipid panel results and utilize a 10-year ASCVD risk calculator to guide statin therapy.  He expressed understanding.  We will repeat a lipid panel today and follow-up to discuss results.  I have also offered to order a coronary calcium scan to provide another objective measure that could argue for or against statin therapy.      Refused influenza vaccine - Primary    He declined influenza vaccination today      Return in about 3 months (around 09/21/2022).    Johnette Abraham, MD

## 2022-06-21 NOTE — Patient Instructions (Addendum)
It was a pleasure to see you today.  Thank you for giving [place] the opportunity to be involved in your care.  Below is a brief recap of your visit and next steps.  We will plan to see you again in 3 months.  Summary We will repeat labs today  Next steps Follow up in 3 months I will notify you of lab results I recommend you establish care with an eye doctor

## 2022-06-23 LAB — CBC WITH DIFFERENTIAL/PLATELET
Basophils Absolute: 0 10*3/uL (ref 0.0–0.2)
Basos: 1 %
EOS (ABSOLUTE): 0.1 10*3/uL (ref 0.0–0.4)
Eos: 2 %
Hematocrit: 44.9 % (ref 37.5–51.0)
Hemoglobin: 15.7 g/dL (ref 13.0–17.7)
Immature Grans (Abs): 0 10*3/uL (ref 0.0–0.1)
Immature Granulocytes: 0 %
Lymphocytes Absolute: 2.4 10*3/uL (ref 0.7–3.1)
Lymphs: 35 %
MCH: 31.4 pg (ref 26.6–33.0)
MCHC: 35 g/dL (ref 31.5–35.7)
MCV: 90 fL (ref 79–97)
Monocytes Absolute: 0.5 10*3/uL (ref 0.1–0.9)
Monocytes: 8 %
Neutrophils Absolute: 3.8 10*3/uL (ref 1.4–7.0)
Neutrophils: 54 %
Platelets: 183 10*3/uL (ref 150–450)
RBC: 5 x10E6/uL (ref 4.14–5.80)
RDW: 11.7 % (ref 11.6–15.4)
WBC: 6.9 10*3/uL (ref 3.4–10.8)

## 2022-06-23 LAB — LIPID PANEL
Chol/HDL Ratio: 3.8 ratio (ref 0.0–5.0)
Cholesterol, Total: 192 mg/dL (ref 100–199)
HDL: 50 mg/dL (ref >39)
LDL Chol Calc (NIH): 131 mg/dL — ABNORMAL HIGH (ref 0–99)
Triglycerides: 59 mg/dL (ref 0–149)
VLDL Cholesterol Cal: 11 mg/dL (ref 5–40)

## 2022-06-23 LAB — CMP14+EGFR
ALT: 25 IU/L (ref 0–44)
AST: 30 IU/L (ref 0–40)
Albumin/Globulin Ratio: 2.8 — ABNORMAL HIGH (ref 1.2–2.2)
Albumin: 5 g/dL — ABNORMAL HIGH (ref 3.8–4.9)
Alkaline Phosphatase: 45 IU/L (ref 44–121)
BUN/Creatinine Ratio: 19 (ref 9–20)
BUN: 19 mg/dL (ref 6–24)
Bilirubin Total: 0.5 mg/dL (ref 0.0–1.2)
CO2: 22 mmol/L (ref 20–29)
Calcium: 9.4 mg/dL (ref 8.7–10.2)
Chloride: 104 mmol/L (ref 96–106)
Creatinine, Ser: 0.99 mg/dL (ref 0.76–1.27)
Globulin, Total: 1.8 g/dL (ref 1.5–4.5)
Glucose: 100 mg/dL — ABNORMAL HIGH (ref 70–99)
Potassium: 4.7 mmol/L (ref 3.5–5.2)
Sodium: 141 mmol/L (ref 134–144)
Total Protein: 6.8 g/dL (ref 6.0–8.5)
eGFR: 89 mL/min/{1.73_m2} (ref >59)

## 2022-06-23 LAB — MICROALBUMIN / CREATININE URINE RATIO
Creatinine, Urine: 23.3 mg/dL
Microalb/Creat Ratio: <13 mg/g creat (ref 0–29)
Microalbumin, Urine: <3 ug/mL

## 2022-06-23 LAB — HEMOGLOBIN A1C
Est. average glucose Bld gHb Est-mCnc: 117 mg/dL
Hgb A1c MFr Bld: 5.7 % — ABNORMAL HIGH (ref 4.8–5.6)

## 2022-06-23 NOTE — Assessment & Plan Note (Signed)
Symptoms well controlled with finasteride.  No changes today.

## 2022-06-23 NOTE — Assessment & Plan Note (Signed)
He has previously declined statin therapy due to concern for long-term side effects.  He was prescribed Zetia 10 mg daily following his repeat lipid panel from February.  However, he has not been taking his medication because he thought it was a statin. -I reviewed with Mr. Goynes that ezetimibe is not a statin.  He is concerned about the long-term side effects of statin therapy, and is not sure that they would outweigh the benefits.  I acknowledged his concerns and reviewed with Mr. Moreland how we interpret lipid panel results and utilize a 10-year ASCVD risk calculator to guide statin therapy.  He expressed understanding.  We will repeat a lipid panel today and follow-up to discuss results.  I have also offered to order a coronary calcium scan to provide another objective measure that could argue for or against statin therapy.

## 2022-06-23 NOTE — Assessment & Plan Note (Signed)
Last HgbA1c 5.6.  He is not currently taking any medications for diabetes control and has improved his A1c through lifestyle modifications.  He denies symptoms of polyuria/polydipsia currently. -No changes today -Repeat HgbA1c -Diabetic foot exam completed today -Urine microalbumin/creatinine ratio ordered -He is due for a diabetic eye exam and will contact his ophthalmologist to schedule appointment

## 2022-06-23 NOTE — Assessment & Plan Note (Signed)
BP 132/82 today.  He is currently prescribed lisinopril 5 mg daily. -No changes today.  Continue current regimen

## 2022-06-23 NOTE — Assessment & Plan Note (Signed)
He declined influenza vaccination today

## 2022-09-22 ENCOUNTER — Ambulatory Visit: Payer: BC Managed Care – PPO | Admitting: Internal Medicine

## 2022-10-18 ENCOUNTER — Ambulatory Visit: Payer: BC Managed Care – PPO | Admitting: Internal Medicine

## 2022-10-18 ENCOUNTER — Encounter: Payer: Self-pay | Admitting: Internal Medicine

## 2022-10-18 VITALS — BP 129/80 | HR 91 | Ht 69.0 in | Wt 181.0 lb

## 2022-10-18 DIAGNOSIS — E1169 Type 2 diabetes mellitus with other specified complication: Secondary | ICD-10-CM

## 2022-10-18 DIAGNOSIS — E782 Mixed hyperlipidemia: Secondary | ICD-10-CM

## 2022-10-18 DIAGNOSIS — E119 Type 2 diabetes mellitus without complications: Secondary | ICD-10-CM

## 2022-10-18 DIAGNOSIS — I10 Essential (primary) hypertension: Secondary | ICD-10-CM | POA: Diagnosis not present

## 2022-10-18 DIAGNOSIS — E785 Hyperlipidemia, unspecified: Secondary | ICD-10-CM

## 2022-10-18 MED ORDER — ROSUVASTATIN CALCIUM 20 MG PO TABS: 20.0000 mg | ORAL_TABLET | Freq: Every day | ORAL | 3 refills | Status: DC

## 2022-10-18 NOTE — Patient Instructions (Addendum)
It was a pleasure to see you today.  Thank you for giving [place] the opportunity to be involved in your care.  Below is a brief recap of your visit and next steps.  We will plan to see you again in 6 months.  Summary Start rosuvastatin 20 mg daily Repeat lipid panel in 6-8 weeks I recommend establishing care with an ophthalmologist for a diabetic eye exam

## 2022-10-18 NOTE — Progress Notes (Signed)
Established Patient Office Visit  Subjective   Patient ID: Chris Chris [name], male    DOB: 10/13/1965  Age: 57 y.o. MRN: [id number]  Chief Complaint  Patient presents with   Diabetes    Follow up   Chris Chris [name].  He was last seen by me on 06/21/22 at which time no medication changes were made.  There have been no acute interval events since his last appointment.  Chris [name] Chris Chris [name] that he is feeling well.  He is asymptomatic and has no acute concerns to discuss.  Past Medical History:  Diagnosis Date   Diabetes mellitus without complication ([place])    Hyperlipidemia    Phreesia 05/06/2020   Hypertension    Past Surgical History:  Procedure Laterality Date   COLONOSCOPY WITH PROPOFOL N/A 06/16/2020   Procedure: COLONOSCOPY WITH PROPOFOL;  Surgeon: Harvel Quale, MD;  [facility];  Service: Gastroenterology;  Laterality: N/A;  [place]     POLYPECTOMY  06/16/2020   Procedure: POLYPECTOMY;  Surgeon: Harvel Quale, MD;  [facility];  Service: Gastroenterology;;   Social History   Tobacco Use   Smoking status: Former    Packs/day: 1.00    Types: Cigarettes    Start date: 09/20/1979    Quit date: 09/20/2007    Years since quitting: 15.1   Smokeless tobacco: Current    Types: Chew  Substance Use Topics   Alcohol use: No    Comment: social 2-4 drinks a week    Drug use: No   History reviewed. No pertinent family history. No Known Allergies  Review of Systems  Constitutional:  Negative for chills and fever.  HENT:  Negative for sore throat.   Respiratory:  Negative for cough and shortness of breath.   Cardiovascular:  Negative for chest pain, palpitations and leg swelling.  Gastrointestinal:  Negative for abdominal pain, blood in stool, constipation, diarrhea, nausea and vomiting.  Genitourinary:  Negative for dysuria and hematuria.  Musculoskeletal:  Negative for myalgias.  Skin:  Negative for  itching and rash.  Neurological:  Negative for dizziness and headaches.  Psychiatric/Behavioral:  Negative for depression and suicidal ideas.       Objective:     BP 129/80   Pulse 91   Ht '5\' 9"'$  (1.753 m)   Wt 181 lb (82.1 kg)   SpO2 97%   BMI 26.73 kg/m  BP Readings from Last 3 Encounters:  10/18/22 129/80  06/21/22 132/82  11/11/21 (!) 144/78   Physical Exam Vitals reviewed.  Constitutional:      General: He is not in acute distress.    Appearance: Normal appearance. He is not ill-appearing.  HENT:     Head: Normocephalic and atraumatic.     Right Ear: External ear normal.     Left Ear: External ear normal.     Nose: Nose normal. No congestion or rhinorrhea.     Mouth/Throat:     Mouth: Mucous membranes are moist.     Pharynx: Oropharynx is clear.  Eyes:     Extraocular Movements: Extraocular movements intact.     Conjunctiva/sclera: Conjunctivae normal.     Pupils: Pupils are equal, round, and reactive to light.  Cardiovascular:     Rate and Rhythm: Normal rate and regular rhythm.     Pulses: Normal pulses.     Heart sounds: Normal heart sounds. No murmur heard. Pulmonary:     Effort: Pulmonary effort  is normal.     Breath sounds: Normal breath sounds. No wheezing, rhonchi or rales.  Abdominal:     General: Abdomen is flat. Bowel sounds are normal. There is no distension.     Palpations: Abdomen is soft.     Tenderness: There is no abdominal tenderness.  Musculoskeletal:        General: No swelling or deformity. Normal range of motion.     Cervical back: Normal range of motion.  Skin:    General: Skin is warm and dry.     Capillary Refill: Capillary refill takes less than 2 seconds.  Neurological:     General: No focal deficit present.     Mental Status: He is alert and oriented to person, place, and time.     Motor: No weakness.  Psychiatric:        Mood and Affect: Mood normal.        Behavior: Behavior normal.        Thought Content: Thought content  normal.   Last CBC Lab Results  Component Value Date   WBC 6.9 06/21/2022   HGB 15.7 06/21/2022   HCT 44.9 06/21/2022   MCV 90 06/21/2022   MCH 31.4 06/21/2022   RDW 11.7 06/21/2022   PLT 183 60/73/7106   Last metabolic panel Lab Results  Component Value Date   GLUCOSE 100 (H) 06/21/2022   NA 141 06/21/2022   K 4.7 06/21/2022   CL 104 06/21/2022   CO2 22 06/21/2022   BUN 19 06/21/2022   CREATININE 0.99 06/21/2022   EGFR 89 06/21/2022   CALCIUM 9.4 06/21/2022   PROT 6.8 06/21/2022   ALBUMIN 5.0 (H) 06/21/2022   LABGLOB 1.8 06/21/2022   AGRATIO 2.8 (H) 06/21/2022   BILITOT 0.5 06/21/2022   ALKPHOS 45 06/21/2022   AST 30 06/21/2022   ALT 25 06/21/2022   ANIONGAP 10 06/15/2020   Last lipids Lab Results  Component Value Date   CHOL 192 06/21/2022   HDL 50 06/21/2022   LDLCALC 131 (H) 06/21/2022   TRIG 59 06/21/2022   CHOLHDL 3.8 06/21/2022   Last hemoglobin A1c Lab Results  Component Value Date   HGBA1C 5.7 (H) 06/21/2022   Last thyroid functions Lab Results  Component Value Date   TSH 1.390 05/06/2021   The 10-year ASCVD risk score (Arnett DK, et al., 2019) is: 15.4%    Assessment & Plan:   Problem List Items Addressed This Visit       Essential hypertension    He is currently prescribed lisinopril 5 mg daily for treatment of hypertension.  His blood pressure Chris [name] is 129/80. -No medication changes Chris [name].  Continue current regimen      Diabetes mellitus without complication ([place])    Diet controlled.  Last A1c 5.7 in October. -No changes Chris [name].  He will follow-up with ophthalmology for diabetic eye exam.  Additional diabetes related HM items are up-to-date.      Hyperlipidemia associated with type 2 diabetes mellitus ([place]) - Primary    Lipid panel updated in October.  Total cholesterol 192 and LDL 131.  His 10-year ASCVD risk or Chris [name] is 15.4%.  He has previously declined statin therapy. -I again reviewed the indications for statin therapy with Mr.  Chris [name].  Through shared decision-making, he is in agreement with starting rosuvastatin 20 mg daily. -Repeat lipid panel in 6-8 weeks       Return in about 6 months (around 04/18/2023) for HLD.    Johnette Abraham,  MD

## 2022-10-24 NOTE — Assessment & Plan Note (Addendum)
Lipid panel updated in October.  Total cholesterol 192 and LDL 131.  His 10-year ASCVD risk or today is 15.4%.  He has previously declined statin therapy. -I again reviewed the indications for statin therapy with Chris [name].  Through shared decision-making, he is in agreement with starting rosuvastatin 20 mg daily. -Repeat lipid panel in 6-8 weeks

## 2022-10-24 NOTE — Assessment & Plan Note (Signed)
He is currently prescribed lisinopril 5 mg daily for treatment of hypertension.  His blood pressure today is 129/80. -No medication changes today.  Continue current regimen

## 2022-10-24 NOTE — Assessment & Plan Note (Addendum)
Diet controlled.  Last A1c 5.7 in October. -No changes today.  He will follow-up with ophthalmology for diabetic eye exam.  Additional diabetes related HM items are up-to-date.

## 2022-11-04 ENCOUNTER — Other Ambulatory Visit: Payer: Self-pay | Admitting: Internal Medicine

## 2022-11-04 ENCOUNTER — Other Ambulatory Visit: Payer: Self-pay | Admitting: Nurse Practitioner

## 2022-11-04 DIAGNOSIS — E7800 Pure hypercholesterolemia, unspecified: Secondary | ICD-10-CM

## 2022-11-04 DIAGNOSIS — N401 Enlarged prostate with lower urinary tract symptoms: Secondary | ICD-10-CM

## 2022-11-05 ENCOUNTER — Other Ambulatory Visit: Payer: Self-pay | Admitting: Nurse Practitioner

## 2022-11-05 DIAGNOSIS — I10 Essential (primary) hypertension: Secondary | ICD-10-CM

## 2022-11-07 ENCOUNTER — Other Ambulatory Visit: Payer: Self-pay | Admitting: Internal Medicine

## 2022-11-07 DIAGNOSIS — I10 Essential (primary) hypertension: Secondary | ICD-10-CM

## 2022-11-28 ENCOUNTER — Encounter: Payer: Self-pay | Admitting: Internal Medicine

## 2023-04-19 ENCOUNTER — Ambulatory Visit: Payer: BC Managed Care – PPO | Admitting: Internal Medicine

## 2023-05-01 ENCOUNTER — Other Ambulatory Visit: Payer: Self-pay | Admitting: Internal Medicine

## 2023-05-01 DIAGNOSIS — N401 Enlarged prostate with lower urinary tract symptoms: Secondary | ICD-10-CM

## 2023-05-01 DIAGNOSIS — I10 Essential (primary) hypertension: Secondary | ICD-10-CM

## 2023-07-05 ENCOUNTER — Encounter: Payer: Self-pay | Admitting: Internal Medicine

## 2023-07-19 LAB — HM DIABETES EYE EXAM

## 2023-09-06 ENCOUNTER — Encounter: Payer: Self-pay | Admitting: Internal Medicine

## 2023-09-06 NOTE — Telephone Encounter (Signed)
 Care team updated and letter sent for eye exam notes.

## 2023-10-20 ENCOUNTER — Encounter: Payer: BC Managed Care – PPO | Admitting: Internal Medicine

## 2023-10-25 ENCOUNTER — Other Ambulatory Visit: Payer: Self-pay | Admitting: Internal Medicine

## 2023-10-25 DIAGNOSIS — I10 Essential (primary) hypertension: Secondary | ICD-10-CM

## 2023-10-25 DIAGNOSIS — N401 Enlarged prostate with lower urinary tract symptoms: Secondary | ICD-10-CM

## 2023-12-01 ENCOUNTER — Encounter: Payer: BC Managed Care – PPO | Admitting: Internal Medicine

## 2024-01-25 ENCOUNTER — Encounter: Payer: Self-pay | Admitting: Family Medicine

## 2024-01-29 ENCOUNTER — Ambulatory Visit (INDEPENDENT_AMBULATORY_CARE_PROVIDER_SITE_OTHER)

## 2024-01-29 VITALS — BP 158/83 | HR 92 | Ht 68.0 in | Wt 175.1 lb

## 2024-01-29 DIAGNOSIS — Z0001 Encounter for general adult medical examination with abnormal findings: Secondary | ICD-10-CM | POA: Diagnosis not present

## 2024-01-29 DIAGNOSIS — E785 Hyperlipidemia, unspecified: Secondary | ICD-10-CM | POA: Diagnosis not present

## 2024-01-29 DIAGNOSIS — E1169 Type 2 diabetes mellitus with other specified complication: Secondary | ICD-10-CM

## 2024-01-29 DIAGNOSIS — Z0000 Encounter for general adult medical examination without abnormal findings: Secondary | ICD-10-CM

## 2024-01-29 DIAGNOSIS — E119 Type 2 diabetes mellitus without complications: Secondary | ICD-10-CM

## 2024-01-29 DIAGNOSIS — Z125 Encounter for screening for malignant neoplasm of prostate: Secondary | ICD-10-CM | POA: Diagnosis not present

## 2024-01-29 MED ORDER — TRIAMCINOLONE ACETONIDE 55 MCG/ACT NA AERO: 2.0000 | INHALATION_SPRAY | Freq: Every day | NASAL | 12 refills | Status: AC

## 2024-01-29 NOTE — Progress Notes (Unsigned)
 Complete physical exam  Patient: Chris [name]   DOB: 06-Feb-1966   58 y.o. Male  MRN: [id number]  Subjective:     Chief Complaint  Patient presents with   Medical Management of Chronic Issues    Pt here for an annual CPE    Chris [name] is a 58 y.o. male who presents today for a complete physical exam. He reports consuming a {diet types:17450} diet. {types:19826} He generally feels {DESC; WELL/FAIRLY WELL/POORLY:18703}. He reports sleeping {DESC; WELL/FAIRLY WELL/POORLY:18703}. He {does/does not:200015} have additional problems to discuss today.    Most recent fall risk assessment:    01/29/2024    3:54 PM  Fall Risk   Falls in the past year? 0  Number falls in past yr: 0  Injury with Fall? 0  Risk for fall due to : No Fall Risks  Follow up Falls evaluation completed     Most recent depression screenings:    01/29/2024    3:55 PM 10/18/2022    3:24 PM  PHQ 2/9 Scores  PHQ - 2 Score 0 0  PHQ- 9 Score 0 0    {VISON DENTAL STD PSA (Optional):27386}  {History (Optional):23778}  Patient Care Team: Tobi Fortes, MD as PCP - General (Internal Medicine) Lucendia Rusk, DO (Optometry)   Outpatient Medications Prior to Visit  Medication Sig   finasteride  (PROSCAR ) 5 MG tablet TAKE 1 TABLET (5 MG TOTAL) BY MOUTH DAILY.   ibuprofen (ADVIL) 200 MG tablet Take 400 mg by mouth every 8 (eight) hours as needed for moderate pain.   lisinopril  (ZESTRIL ) 5 MG tablet TAKE 1 TABLET (5 MG TOTAL) BY MOUTH DAILY.   loratadine (CLARITIN) 10 MG tablet Take 10 mg by mouth daily as needed for allergies.   Multiple Vitamin (MULTIVITAMIN WITH MINERALS) TABS tablet Take 1 tablet by mouth daily.   Cholecalciferol (VITAMIN D3 PO) Take 1 capsule by mouth daily.   ezetimibe  (ZETIA ) 10 MG tablet TAKE 1 TABLET BY MOUTH EVERY DAY   Magnesium 500 MG CAPS Take by mouth.   Menaquinone-7 (VITAMIN K2) 100 MCG CAPS Take by mouth.   rosuvastatin  (CRESTOR ) 20 MG tablet Take 1 tablet (20 mg total) by  mouth daily.   No facility-administered medications prior to visit.    ROS        Objective:     BP (!) 158/83   Pulse 92   Ht 5\' 8"  (1.727 m)   Wt 175 lb 1.3 oz (79.4 kg)   SpO2 98%   BMI 26.62 kg/m  {Vitals History (Optional):23777}  Physical Exam   No results found for any visits on 01/29/24. {Show previous labs (optional):23779}    Assessment & Plan:    Routine Health Maintenance and Physical Exam   There is no immunization history on file for this patient.  Health Maintenance  Topic Date Due   DTaP/Tdap/Td (1 - Tdap) Never done   Pneumococcal Vaccine 21-78 Years old (1 of 2 - PCV) Never done   Zoster Vaccines- Shingrix (1 of 2) Never done   HEMOGLOBIN A1C  12/21/2022   Diabetic kidney evaluation - eGFR measurement  06/22/2023   Diabetic kidney evaluation - Urine ACR  06/22/2023   FOOT EXAM  06/22/2023   INFLUENZA VACCINE  04/19/2024   OPHTHALMOLOGY EXAM  07/18/2024   Colonoscopy  06/17/2027   Hepatitis C Screening  Completed   HIV Screening  Completed   HPV VACCINES  Aged Out   Meningococcal B Vaccine  Aged Out  COVID-19 Vaccine  Discontinued    Discussed health benefits of physical activity, and encouraged him to engage in regular exercise appropriate for his age and condition.  Problem List Items Addressed This Visit   None Visit Diagnoses       Encounter for preventative adult health care examination    -  Primary      No follow-ups on file.     Alison Irvine, FNP

## 2024-01-31 NOTE — Assessment & Plan Note (Signed)
 Recheck fasting labs.  He is currently not taking the rosuvastatin .

## 2024-01-31 NOTE — Assessment & Plan Note (Signed)
Diet controlled.  Last A1c 5.7 in October. -No changes today.  He will follow-up with ophthalmology for diabetic eye exam.  Additional diabetes related HM items are up-to-date.

## 2024-02-11 LAB — LIPID PANEL
Chol/HDL Ratio: 6.7 ratio — ABNORMAL HIGH (ref 0.0–5.0)
Cholesterol, Total: 289 mg/dL — ABNORMAL HIGH (ref 100–199)
HDL: 43 mg/dL (ref >39)
LDL Chol Calc (NIH): 212 mg/dL — ABNORMAL HIGH (ref 0–99)
Triglycerides: 175 mg/dL — ABNORMAL HIGH (ref 0–149)
VLDL Cholesterol Cal: 34 mg/dL (ref 5–40)

## 2024-02-11 LAB — CMP14+EGFR
ALT: 34 IU/L (ref 0–44)
AST: 29 IU/L (ref 0–40)
Albumin: 4.7 g/dL (ref 3.8–4.9)
Alkaline Phosphatase: 44 IU/L (ref 44–121)
BUN/Creatinine Ratio: 19 (ref 9–20)
BUN: 18 mg/dL (ref 6–24)
Bilirubin Total: 0.4 mg/dL (ref 0.0–1.2)
CO2: 22 mmol/L (ref 20–29)
Calcium: 9.4 mg/dL (ref 8.7–10.2)
Chloride: 102 mmol/L (ref 96–106)
Creatinine, Ser: 0.93 mg/dL (ref 0.76–1.27)
Globulin, Total: 2.1 g/dL (ref 1.5–4.5)
Glucose: 109 mg/dL — ABNORMAL HIGH (ref 70–99)
Potassium: 4.3 mmol/L (ref 3.5–5.2)
Sodium: 141 mmol/L (ref 134–144)
Total Protein: 6.8 g/dL (ref 6.0–8.5)
eGFR: 95 mL/min/{1.73_m2} (ref >59)

## 2024-02-11 LAB — PSA: Prostate Specific Ag, Serum: <0.1 ng/mL (ref 0.0–4.0)

## 2024-02-11 LAB — HEMOGLOBIN A1C
Est. average glucose Bld gHb Est-mCnc: 114 mg/dL
Hgb A1c MFr Bld: 5.6 % (ref 4.8–5.6)

## 2024-02-11 LAB — MICROALBUMIN / CREATININE URINE RATIO: Creatinine, Urine: 86.7 mg/dL

## 2024-02-14 ENCOUNTER — Ambulatory Visit: Payer: Self-pay

## 2024-04-19 ENCOUNTER — Other Ambulatory Visit: Payer: Self-pay | Admitting: Internal Medicine

## 2024-04-19 DIAGNOSIS — I10 Essential (primary) hypertension: Secondary | ICD-10-CM

## 2024-04-19 DIAGNOSIS — N401 Enlarged prostate with lower urinary tract symptoms: Secondary | ICD-10-CM

## 2024-10-12 ENCOUNTER — Other Ambulatory Visit: Payer: Self-pay

## 2024-10-12 DIAGNOSIS — N401 Enlarged prostate with lower urinary tract symptoms: Secondary | ICD-10-CM

## 2024-10-12 DIAGNOSIS — I10 Essential (primary) hypertension: Secondary | ICD-10-CM
